# Patient Record
Sex: Female | Born: 1985 | Race: White | Hispanic: No | Smoking: Never smoker
Health system: Southern US, Community
[De-identification: ages and names within clinical notes are randomized; demographics above are authoritative.]

## PROBLEM LIST (undated history)

## (undated) DIAGNOSIS — F329 Major depressive disorder, single episode, unspecified: Secondary | ICD-10-CM

## (undated) DIAGNOSIS — F32A Depression, unspecified: Secondary | ICD-10-CM

## (undated) DIAGNOSIS — G43909 Migraine, unspecified, not intractable, without status migrainosus: Secondary | ICD-10-CM

## (undated) DIAGNOSIS — F419 Anxiety disorder, unspecified: Secondary | ICD-10-CM

## (undated) HISTORY — PX: DENTAL SURGERY: SHX609

---

## 2013-08-31 DIAGNOSIS — Z862 Personal history of diseases of the blood and blood-forming organs and certain disorders involving the immune mechanism: Secondary | ICD-10-CM | POA: Insufficient documentation

## 2013-08-31 DIAGNOSIS — Z8669 Personal history of other diseases of the nervous system and sense organs: Secondary | ICD-10-CM | POA: Insufficient documentation

## 2015-09-10 ENCOUNTER — Emergency Department (HOSPITAL_BASED_OUTPATIENT_CLINIC_OR_DEPARTMENT_OTHER): Payer: Managed Care, Other (non HMO)

## 2015-09-10 ENCOUNTER — Emergency Department (HOSPITAL_BASED_OUTPATIENT_CLINIC_OR_DEPARTMENT_OTHER)
Admission: EM | Admit: 2015-09-10 | Discharge: 2015-09-11 | Disposition: A | Discharge status: Home / Self Care | Payer: Managed Care, Other (non HMO) | Attending: Emergency Medicine | Admitting: Emergency Medicine

## 2015-09-10 ENCOUNTER — Encounter (HOSPITAL_BASED_OUTPATIENT_CLINIC_OR_DEPARTMENT_OTHER): Payer: Self-pay

## 2015-09-10 DIAGNOSIS — G43909 Migraine, unspecified, not intractable, without status migrainosus: Secondary | ICD-10-CM | POA: Diagnosis not present

## 2015-09-10 DIAGNOSIS — S99911A Unspecified injury of right ankle, initial encounter: Secondary | ICD-10-CM | POA: Diagnosis present

## 2015-09-10 DIAGNOSIS — Y9289 Other specified places as the place of occurrence of the external cause: Secondary | ICD-10-CM | POA: Insufficient documentation

## 2015-09-10 DIAGNOSIS — F419 Anxiety disorder, unspecified: Secondary | ICD-10-CM | POA: Diagnosis not present

## 2015-09-10 DIAGNOSIS — S93401A Sprain of unspecified ligament of right ankle, initial encounter: Secondary | ICD-10-CM | POA: Insufficient documentation

## 2015-09-10 DIAGNOSIS — Y998 Other external cause status: Secondary | ICD-10-CM | POA: Diagnosis not present

## 2015-09-10 DIAGNOSIS — X501XXA Overexertion from prolonged static or awkward postures, initial encounter: Secondary | ICD-10-CM | POA: Diagnosis not present

## 2015-09-10 DIAGNOSIS — F329 Major depressive disorder, single episode, unspecified: Secondary | ICD-10-CM | POA: Insufficient documentation

## 2015-09-10 DIAGNOSIS — Z79899 Other long term (current) drug therapy: Secondary | ICD-10-CM | POA: Insufficient documentation

## 2015-09-10 DIAGNOSIS — Y9389 Activity, other specified: Secondary | ICD-10-CM | POA: Diagnosis not present

## 2015-09-10 HISTORY — DX: Depression, unspecified: F32.A

## 2015-09-10 HISTORY — DX: Anxiety disorder, unspecified: F41.9

## 2015-09-10 HISTORY — DX: Major depressive disorder, single episode, unspecified: F32.9

## 2015-09-10 HISTORY — DX: Migraine, unspecified, not intractable, without status migrainosus: G43.909

## 2015-09-10 MED ORDER — HYDROCODONE-ACETAMINOPHEN 5-325 MG PO TABS: 1.0000 | ORAL_TABLET | ORAL | Status: DC | PRN

## 2015-09-10 MED ORDER — HYDROCODONE-ACETAMINOPHEN 5-325 MG PO TABS
1.0000 | ORAL_TABLET | Freq: Once | ORAL | Status: AC
  Administered 2015-09-10: 1 via ORAL
  Filled 2015-09-10: qty 1

## 2015-09-10 NOTE — ED Notes (Signed)
Swelling noted to lateral aspect of rt ankle. Ice and elevation to RLE implemented for pt comfort

## 2015-09-10 NOTE — ED Notes (Addendum)
States has taken Ibuprofen today, takes 4 tabs ( ) , taken at 0800, 1200, 1600hrs. No relief per pt statement

## 2015-09-10 NOTE — ED Notes (Signed)
Pt states she rec injury to rt ankle approx 0730hrs today, stepped off curb and felt foot twist and heard a "pop", presents with pain to rt ankle

## 2015-09-10 NOTE — ED Notes (Signed)
DC instructions reviewed with pt, discussed safety while taking PO pain med as prescribed by EDP, also discussed, ice application and elevation to aid in comfort measures. Crutch walking instructions provided by EDP. Opportunity for questions provided. Teach Back Method used. Significant other here with pt to drive her home

## 2015-09-10 NOTE — ED Provider Notes (Signed)
CSN: [id number]     Arrival date & time 09/10/15  1734 History   First MD Initiated Contact with Patient 09/10/15 1743     Chief Complaint  Patient presents with  . Ankle Injury      HPI  Patient presents with dilation of her ankle. Inversion right ankle getting out of a cart this morning. Complains of pain and swelling and walks with a limp. Heard "pop". No prior ankle injuries.  Past Medical History  Diagnosis Date  . Migraine   . Anxiety   . Depression    Past Surgical History  Procedure Laterality Date  . Dental surgery     No family history on file. Social History  Substance Use Topics  . Smoking status: Never Smoker   . Smokeless tobacco: None  . Alcohol Use: No   OB History    No data available     Review of Systems  Constitutional: Negative for fever, chills, diaphoresis, appetite change and fatigue.  HENT: Negative for mouth sores, sore throat and trouble swallowing.   Eyes: Negative for visual disturbance.  Respiratory: Negative for cough, chest tightness, shortness of breath and wheezing.   Cardiovascular: Negative for chest pain.  Gastrointestinal: Negative for nausea, vomiting, abdominal pain, diarrhea and abdominal distention.  Endocrine: Negative for polydipsia, polyphagia and polyuria.  Genitourinary: Negative for dysuria, frequency and hematuria.  Musculoskeletal: Positive for gait problem.       Lateral right ankle pain. No pain proximal in the lower leg.  Skin: Negative for color change, pallor and rash.  Neurological: Negative for dizziness, syncope, light-headedness and headaches.  Hematological: Does not bruise/bleed easily.  Psychiatric/Behavioral: Negative for behavioral problems and confusion.      Allergies  Sulfa antibiotics  Home Medications   Prior to Admission medications   Medication Sig Start Date End Date Taking? Authorizing Provider  atenolol (TENORMIN) 25 MG tablet Take 10 mg by mouth daily.   Yes Historical Provider, MD    HYDROcodone-acetaminophen (NORCO/VICODIN) 5-325 MG tablet Take 1 tablet by mouth every 4 (four) hours as needed. 09/10/15   Rolland Porter, MD  ondansetron (ZOFRAN-ODT) 4 MG disintegrating tablet Take 4 mg by mouth every 8 (eight) hours as needed for nausea or vomiting.   Yes Historical Provider, MD  PARoxetine (PAXIL) 10 MG tablet Take 40 mg by mouth daily.   Yes Historical Provider, MD  rizatriptan (MAXALT) 10 MG tablet Take 10 mg by mouth as needed for migraine. May repeat in 2 hours if needed   Yes Historical Provider, MD   BP 140/91 mmHg  Pulse 74  Temp(Src) 97.8 F (36.6 C) (Oral)  Resp 20  Ht 5' 5.5" (1.664 m)  Wt 211 lb (95.709 kg)  BMI 34.57 kg/m2  SpO2 100%  LMP 09/03/2015 Physical Exam  Constitutional: She is oriented to person, place, and time. She appears well-developed and well-nourished. No distress.  HENT:  Head: Normocephalic.  Eyes: Conjunctivae are normal. Pupils are equal, round, and reactive to light. No scleral icterus.  Neck: Normal range of motion. Neck supple. No thyromegaly present.  Cardiovascular: Normal rate and regular rhythm.  Exam reveals no gallop and no friction rub.   No murmur heard. Pulmonary/Chest: Effort normal and breath sounds normal. No respiratory distress. She has no wheezes. She has no rales.  Abdominal: Soft. Bowel sounds are normal. She exhibits no distension. There is no tenderness. There is no rebound.  Musculoskeletal: Normal range of motion.       Feet:  Neurological:  She is alert and oriented to person, place, and time.  Skin: Skin is warm and dry. No rash noted.  Psychiatric: She has a normal mood and affect. Her behavior is normal.    ED Course  Procedures (including critical care time) Labs Review Labs Reviewed - No data to display  Imaging Review Dg Ankle Complete Right  09/10/2015  CLINICAL DATA:  Twisting injury right ankle today with pain and swelling laterally. Initial encounter. EXAM: RIGHT ANKLE - COMPLETE 3+ VIEW  COMPARISON:  None. FINDINGS: There is no evidence of fracture, dislocation, or joint effusion. There is no evidence of arthropathy or other focal bone abnormality. Soft tissues are unremarkable. IMPRESSION: Negative exam. Electronically Signed   By: Drusilla Kanner M.D.   On: 09/10/2015 18:15   I have personally reviewed and evaluated these images and lab results as part of my medical decision-making.   EKG Interpretation None      MDM   Final diagnoses:  Ankle sprain, right, initial encounter    No fracture. Educated regarding care and treatment of her lateral ankle sprain. Nonweightbearing for 48 hours. Slowly increase weightbearing as tolerated.    Rolland Porter, MD 09/10/15 [phone]

## 2015-09-10 NOTE — Discharge Instructions (Signed)
Nonweightbearing for the next 48 hours. Slowly increase her weightbearing as your pain and swelling improved.   Ankle Sprain An ankle sprain is an injury to the strong, fibrous tissues (ligaments) that hold the bones of your ankle joint together.  CAUSES An ankle sprain is usually caused by a fall or by twisting your ankle. Ankle sprains most commonly occur when you step on the outer edge of your foot, and your ankle turns inward. People who participate in sports are more prone to these types of injuries.  SYMPTOMS   Pain in your ankle. The pain may be present at rest or only when you are trying to stand or walk.  Swelling.  Bruising. Bruising may develop immediately or within 1 to 2 days after your injury.  Difficulty standing or walking, particularly when turning corners or changing directions. DIAGNOSIS  Your caregiver will ask you details about your injury and perform a physical exam of your ankle to determine if you have an ankle sprain. During the physical exam, your caregiver will press on and apply pressure to specific areas of your foot and ankle. Your caregiver will try to move your ankle in certain ways. An X-ray exam may be done to be sure a bone was not broken or a ligament did not separate from one of the bones in your ankle (avulsion fracture).  TREATMENT  Certain types of braces can help stabilize your ankle. Your caregiver can make a recommendation for this. Your caregiver may recommend the use of medicine for pain. If your sprain is severe, your caregiver may refer you to a surgeon who helps to restore function to parts of your skeletal system (orthopedist) or a physical therapist. HOME CARE INSTRUCTIONS   Apply ice to your injury for 1-2 days or as directed by your caregiver. Applying ice helps to reduce inflammation and pain.  Put ice in a plastic bag.  Place a towel between your skin and the bag.  Leave the ice on for 15-20 minutes at a time, every 2 hours while you  are awake.  Only take over-the-counter or prescription medicines for pain, discomfort, or fever as directed by your caregiver.  Elevate your injured ankle above the level of your heart as much as possible for 2-3 days.  If your caregiver recommends crutches, use them as instructed. Gradually put weight on the affected ankle. Continue to use crutches or a cane until you can walk without feeling pain in your ankle.  If you have a plaster splint, wear the splint as directed by your caregiver. Do not rest it on anything harder than a pillow for the first 24 hours. Do not put weight on it. Do not get it wet. You may take it off to take a shower or bath.  You may have been given an elastic bandage to wear around your ankle to provide support. If the elastic bandage is too tight (you have numbness or tingling in your foot or your foot becomes cold and blue), adjust the bandage to make it comfortable.  If you have an air splint, you may blow more air into it or let air out to make it more comfortable. You may take your splint off at night and before taking a shower or bath. Wiggle your toes in the splint several times per day to decrease swelling. SEEK MEDICAL CARE IF:   You have rapidly increasing bruising or swelling.  Your toes feel extremely cold or you lose feeling in your foot.  Your  pain is not relieved with medicine. SEEK IMMEDIATE MEDICAL CARE IF:  Your toes are numb or blue.  You have severe pain that is increasing. MAKE SURE YOU:   Understand these instructions.  Will watch your condition.  Will get help right away if you are not doing well or get worse.   This information is not intended to replace advice given to you by your health care provider. Make sure you discuss any questions you have with your health care provider.   Document Released: 08/03/2005 Document Revised: 08/24/2014 Document Reviewed: 08/15/2011 Elsevier Interactive Patient Education [employer]

## 2015-09-10 NOTE — ED Notes (Signed)
Pt reports this morning twisted R ankle while getting out of car.  Ambulatory with mild limp.

## 2015-11-01 ENCOUNTER — Emergency Department (INDEPENDENT_AMBULATORY_CARE_PROVIDER_SITE_OTHER)
Admission: EM | Admit: 2015-11-01 | Discharge: 2015-11-01 | Disposition: A | Discharge status: Home / Self Care | Payer: Managed Care, Other (non HMO) | Source: Home / Self Care | Attending: Family Medicine | Admitting: Family Medicine

## 2015-11-01 ENCOUNTER — Emergency Department (INDEPENDENT_AMBULATORY_CARE_PROVIDER_SITE_OTHER): Payer: Managed Care, Other (non HMO)

## 2015-11-01 DIAGNOSIS — M25571 Pain in right ankle and joints of right foot: Secondary | ICD-10-CM | POA: Diagnosis not present

## 2015-11-01 DIAGNOSIS — S93401A Sprain of unspecified ligament of right ankle, initial encounter: Secondary | ICD-10-CM

## 2015-11-01 DIAGNOSIS — M24271 Disorder of ligament, right ankle: Secondary | ICD-10-CM | POA: Diagnosis not present

## 2015-11-01 MED ORDER — HYDROCODONE-ACETAMINOPHEN 5-325 MG PO TABS: ORAL_TABLET | ORAL | Status: DC

## 2015-11-01 NOTE — Discharge Instructions (Signed)
Apply ice pack for 30 minutes every 1 to 2 hours today and tomorrow.  Elevate.  Use crutches for 3 to 5 days.  Wear Ace wrap until swelling decreases.  Wear brace for about 2 to 3 weeks.  Begin range of motion and stretching exercises in about 5 days as per instruction sheet.  May take Ibuprofen 200mg, 4 tabs every 8 hours with food.  ° ° °Ankle Sprain °An ankle sprain is an injury to the strong, fibrous tissues (ligaments) that hold the bones of your ankle joint together.  °CAUSES °An ankle sprain is usually caused by a fall or by twisting your ankle. Ankle sprains most commonly occur when you step on the outer edge of your foot, and your ankle turns inward. People who participate in sports are more prone to these types of injuries.  °SYMPTOMS  °· Pain in your ankle. The pain may be present at rest or only when you are trying to stand or walk. °· Swelling. °· Bruising. Bruising may develop immediately or within 1 to 2 days after your injury. °· Difficulty standing or walking, particularly when turning corners or changing directions. °DIAGNOSIS  °Your caregiver will ask you details about your injury and perform a physical exam of your ankle to determine if you have an ankle sprain. During the physical exam, your caregiver will press on and apply pressure to specific areas of your foot and ankle. Your caregiver will try to move your ankle in certain ways. An X-ray exam may be done to be sure a bone was not broken or a ligament did not separate from one of the bones in your ankle (avulsion fracture).  °TREATMENT  °Certain types of braces can help stabilize your ankle. Your caregiver can make a recommendation for this. Your caregiver may recommend the use of medicine for pain. If your sprain is severe, your caregiver may refer you to a surgeon who helps to restore function to parts of your skeletal system (orthopedist) or a physical therapist. °HOME CARE INSTRUCTIONS  °· Apply ice to your injury for 1-2 days or as  directed by your caregiver. Applying ice helps to reduce inflammation and pain. °¨ Put ice in a plastic bag. °¨ Place a towel between your skin and the bag. °¨ Leave the ice on for 15-20 minutes at a time, every 2 hours while you are awake. °· Only take over-the-counter or prescription medicines for pain, discomfort, or fever as directed by your caregiver. °· Elevate your injured ankle above the level of your heart as much as possible for 2-3 days. °· If your caregiver recommends crutches, use them as instructed. Gradually put weight on the affected ankle. Continue to use crutches or a cane until you can walk without feeling pain in your ankle. °· If you have a plaster splint, wear the splint as directed by your caregiver. Do not rest it on anything harder than a pillow for the first 24 hours. Do not put weight on it. Do not get it wet. You may take it off to take a shower or bath. °· You may have been given an elastic bandage to wear around your ankle to provide support. If the elastic bandage is too tight (you have numbness or tingling in your foot or your foot becomes cold and blue), adjust the bandage to make it comfortable. °· If you have an air splint, you may blow more air into it or let air out to make it more comfortable. You may take   your splint off at night and before taking a shower or bath. Wiggle your toes in the splint several times per day to decrease swelling. °SEEK MEDICAL CARE IF:  °· You have rapidly increasing bruising or swelling. °· Your toes feel extremely cold or you lose feeling in your foot. °· Your pain is not relieved with medicine. °SEEK IMMEDIATE MEDICAL CARE IF: °· Your toes are numb or blue. °· You have severe pain that is increasing. °MAKE SURE YOU:  °· Understand these instructions. °· Will watch your condition. °· Will get help right away if you are not doing well or get worse. °  °This information is not intended to replace advice given to you by your health care provider. Make  sure you discuss any questions you have with your health care provider. °  °Document Released: 08/03/2005 Document Revised: 08/24/2014 Document Reviewed: 08/15/2011 °Elsevier Interactive Patient Education ©2016 [employer] ° °

## 2015-11-01 NOTE — ED Notes (Signed)
Pt stepped off of curb today, rolling her right ankle and heard a pop. Previous similar injury to the same ankle. Pain goes into her foot as well. Used ice and elevation and Advil.

## 2015-11-01 NOTE — ED Provider Notes (Signed)
CSN: [id number]     Arrival date & time 11/01/15  1905 History   First MD Initiated Contact with Patient 11/01/15 2008     Chief Complaint  Patient presents with  . Ankle Pain      HPI Comments: While stepping off a curb today, patient inverted her right ankle and heard a popping sound.  She had a similar injury about 4 months ago.  Patient is a 30 y.o. female presenting with ankle pain. The history is provided by the patient and a parent.  Ankle Pain Location:  Ankle Time since incident:  4 hours Injury: yes   Mechanism of injury comment:  Inverted ankle Ankle location:  R ankle Pain details:    Quality:  Aching   Radiates to: right foot.   Severity:  Moderate   Onset quality:  Sudden   Duration:  4 hours   Timing:  Constant   Progression:  Unchanged Chronicity:  Recurrent Prior injury to area:  Yes Relieved by:  Nothing Worsened by:  Bearing weight Ineffective treatments:  Ice and NSAIDs Associated symptoms: decreased ROM, stiffness and swelling   Associated symptoms: no back pain, no muscle weakness, no numbness and no tingling     Past Medical History  Diagnosis Date  . Migraine   . Anxiety   . Depression    Past Surgical History  Procedure Laterality Date  . Dental surgery     No family history on file. Social History  Substance Use Topics  . Smoking status: Never Smoker   . Smokeless tobacco: Not on file  . Alcohol Use: No   OB History    No data available     Review of Systems  Musculoskeletal: Positive for stiffness. Negative for back pain.  All other systems reviewed and are negative.   Allergies  Sulfa antibiotics  Home Medications   Prior to Admission medications   Medication Sig Start Date End Date Taking? Authorizing Provider  clonazePAM (KLONOPIN) 1 MG tablet Take 1 mg by mouth 2 (two) times daily.   Yes Historical Provider, MD  PARoxetine (PAXIL) 10 MG tablet Take 40 mg by mouth daily.   Yes Historical Provider, MD  atenolol  (TENORMIN) 25 MG tablet Take 10 mg by mouth daily.    Historical Provider, MD  HYDROcodone-acetaminophen (NORCO/VICODIN) 5-325 MG tablet Take one by mouth at bedtime as needed for pain 11/01/15   Lattie HawStephen A Zekiah Caruth, MD  rizatriptan (MAXALT) 10 MG tablet Take 10 mg by mouth as needed for migraine. May repeat in 2 hours if needed    Historical Provider, MD   Meds Ordered and Administered this Visit  Medications - No data to display  BP 129/85 mmHg  Pulse 89  Ht 5' 5.5" (1.664 m)  SpO2 100%  LMP 10/29/2015 No data found.   Physical Exam  Constitutional: She is oriented to person, place, and time. She appears well-developed and well-nourished. No distress.  HENT:  Head: Atraumatic.  Eyes: Pupils are equal, round, and reactive to light.  Neck: Normal range of motion.  Musculoskeletal:       Right ankle: She exhibits decreased range of motion and swelling. She exhibits no ecchymosis, no deformity, no laceration and normal pulse. Tenderness. Lateral malleolus, AITFL and CF ligament tenderness found. No head of 5th metatarsal and no proximal fibula tenderness found. Achilles tendon normal.       Feet:  Neurological: She is alert and oriented to person, place, and time.  Skin: Skin is warm  and dry.  Nursing note and vitals reviewed.   ED Course  Procedures  None   Imaging Review Dg Ankle Complete Right  11/01/2015  CLINICAL DATA:  Stepped off curb and twisted right ankle and foot. Right lateral and anterior ankle and foot pain and swelling. Initial encounter. EXAM: RIGHT ANKLE - COMPLETE 3+ VIEW COMPARISON:  None. FINDINGS: There is no evidence of fracture or dislocation. The ankle mortise is intact; the interosseous space is within normal limits. No talar tilt or subluxation is seen. The joint spaces are preserved. No significant soft tissue abnormalities are seen. IMPRESSION: No evidence of fracture or dislocation. Electronically Signed   By: Roanna Raider M.D.   On: 11/01/2015 19:53   Dg  Foot Complete Right  11/01/2015  CLINICAL DATA:  Lateral and anterior right foot and ankle pain after slipping off a curb this morning, twisting her right ankle and foot and feeling a popping sensation. EXAM: RIGHT FOOT COMPLETE - 3+ VIEW COMPARISON:  Right ankle radiographs dated 09/10/2015 and right ankle radiographs obtained today. FINDINGS: Minimal inferior calcaneal spur formation. No fracture or dislocation seen. IMPRESSION: No fracture. Electronically Signed   By: Beckie Salts M.D.   On: 11/01/2015 19:55      MDM   1. Right ankle sprain, initial encounter   2. Ankle ligament laxity, right    Ace wrap applied.  Rx for Lortab at bedtime.  Patient has crutches and AirCast splint at home. Apply ice pack for 30 minutes every 1 to 2 hours today and tomorrow.  Elevate.  Use crutches for 3 to 5 days.  Wear Ace wrap until swelling decreases.  Wear brace for about 2 to 3 weeks.  Begin range of motion and stretching exercises in about 5 days as per instruction sheet.  May take Ibuprofen , 4 tabs every 8 hours with food.  Followup with Dr. Rodney Langton or Dr. Clementeen [facility] ([facility])       Lattie Haw, MD 11/05/15 [phone]

## 2015-11-02 ENCOUNTER — Telehealth: Payer: Self-pay | Admitting: Emergency Medicine

## 2015-11-02 MED ORDER — OXYCODONE-ACETAMINOPHEN 5-325 MG PO TABS: 1.0000 | ORAL_TABLET | Freq: Four times a day (QID) | ORAL | Status: DC | PRN

## 2015-11-07 ENCOUNTER — Encounter (HOSPITAL_BASED_OUTPATIENT_CLINIC_OR_DEPARTMENT_OTHER): Payer: Self-pay | Admitting: *Deleted

## 2015-11-07 ENCOUNTER — Emergency Department (HOSPITAL_BASED_OUTPATIENT_CLINIC_OR_DEPARTMENT_OTHER): Payer: Managed Care, Other (non HMO)

## 2015-11-07 ENCOUNTER — Emergency Department (HOSPITAL_BASED_OUTPATIENT_CLINIC_OR_DEPARTMENT_OTHER)
Admission: EM | Admit: 2015-11-07 | Discharge: 2015-11-07 | Disposition: A | Discharge status: Home / Self Care | Payer: Managed Care, Other (non HMO) | Attending: Emergency Medicine | Admitting: Emergency Medicine

## 2015-11-07 DIAGNOSIS — F329 Major depressive disorder, single episode, unspecified: Secondary | ICD-10-CM | POA: Insufficient documentation

## 2015-11-07 DIAGNOSIS — Y990 Civilian activity done for income or pay: Secondary | ICD-10-CM | POA: Insufficient documentation

## 2015-11-07 DIAGNOSIS — S6991XA Unspecified injury of right wrist, hand and finger(s), initial encounter: Secondary | ICD-10-CM | POA: Diagnosis present

## 2015-11-07 DIAGNOSIS — M25571 Pain in right ankle and joints of right foot: Secondary | ICD-10-CM

## 2015-11-07 DIAGNOSIS — G43909 Migraine, unspecified, not intractable, without status migrainosus: Secondary | ICD-10-CM | POA: Insufficient documentation

## 2015-11-07 DIAGNOSIS — Y9289 Other specified places as the place of occurrence of the external cause: Secondary | ICD-10-CM | POA: Diagnosis not present

## 2015-11-07 DIAGNOSIS — Y9389 Activity, other specified: Secondary | ICD-10-CM | POA: Insufficient documentation

## 2015-11-07 DIAGNOSIS — Z79899 Other long term (current) drug therapy: Secondary | ICD-10-CM | POA: Diagnosis not present

## 2015-11-07 DIAGNOSIS — W1849XA Other slipping, tripping and stumbling without falling, initial encounter: Secondary | ICD-10-CM | POA: Diagnosis not present

## 2015-11-07 DIAGNOSIS — F419 Anxiety disorder, unspecified: Secondary | ICD-10-CM | POA: Diagnosis not present

## 2015-11-07 MED ORDER — OXYCODONE-ACETAMINOPHEN 5-325 MG PO TABS: 1.0000 | ORAL_TABLET | Freq: Three times a day (TID) | ORAL | Status: DC | PRN

## 2015-11-07 NOTE — ED Notes (Signed)
Right ankle injury. Yesterday she twisted her ankle on the steps at work. Previous injury of same ankle 2 weeks ago.

## 2015-11-07 NOTE — ED Provider Notes (Signed)
CSN: [id number]     Arrival date & time 11/07/15  1953 History   First MD Initiated Contact with Patient 11/07/15 2127     Chief Complaint  Patient presents with  . Ankle Injury     (Consider location/radiation/quality/duration/timing/severity/associated sxs/prior Treatment) HPI Comments: Patient seen for ankle injury about two weeks ago. Yesterday she slipped and twisted her ankle at work.  Patient had been doing well prior to yesterday.  She has an appointment with ortho on 11/12/15.  Patient is a 30 y.o. female presenting with lower extremity injury. The history is provided by the patient. No language interpreter was used.  Ankle Injury This is a recurrent problem. The current episode started yesterday. The problem has been gradually worsening. Associated symptoms include arthralgias and joint swelling. The symptoms are aggravated by walking. She has tried ice and NSAIDs for the symptoms. The treatment provided no relief.    Past Medical History  Diagnosis Date  . Migraine   . Anxiety   . Depression    Past Surgical History  Procedure Laterality Date  . Dental surgery     No family history on file. Social History  Substance Use Topics  . Smoking status: Never Smoker   . Smokeless tobacco: None  . Alcohol Use: No   OB History    No data available     Review of Systems  Musculoskeletal: Positive for joint swelling and arthralgias.  All other systems reviewed and are negative.     Allergies  Sulfa antibiotics  Home Medications   Prior to Admission medications   Medication Sig Start Date End Date Taking? Authorizing Provider  Sertraline HCl (ZOLOFT PO) Take by mouth.   Yes Historical Provider, MD  atenolol (TENORMIN) 25 MG tablet Take 10 mg by mouth daily.    Historical Provider, MD  clonazePAM (KLONOPIN) 1 MG tablet Take 1 mg by mouth 2 (two) times daily.    Historical Provider, MD  HYDROcodone-acetaminophen (NORCO/VICODIN) 5-325 MG tablet Take one by mouth at  bedtime as needed for pain 11/01/15   Lattie Haw, MD  oxyCODONE-acetaminophen (ROXICET) 5-325 MG tablet Take 1 tablet by mouth every 6 (six) hours as needed for severe pain. 11/02/15   Lattie Haw, MD  PARoxetine (PAXIL) 10 MG tablet Take 40 mg by mouth daily.    Historical Provider, MD  rizatriptan (MAXALT) 10 MG tablet Take 10 mg by mouth as needed for migraine. May repeat in 2 hours if needed    Historical Provider, MD   BP 138/89 mmHg  Pulse 74  Temp(Src) 98 F (36.7 C) (Oral)  Resp 18  Ht 5' 5.5" (1.664 m)  Wt 97.07 kg  BMI 35.06 kg/m2  SpO2 100%  LMP 10/29/2015 Physical Exam  Constitutional: She is oriented to person, place, and time. She appears well-developed and well-nourished.  HENT:  Head: Normocephalic.  Eyes: Pupils are equal, round, and reactive to light.  Neck: Neck supple.  Cardiovascular: Normal rate and regular rhythm.   Pulmonary/Chest: Effort normal and breath sounds normal.  Abdominal: Soft.  Musculoskeletal: She exhibits edema and tenderness.       Right ankle: She exhibits decreased range of motion and swelling. She exhibits normal pulse. Tenderness. Lateral malleolus tenderness found.       Feet:  Neurological: She is alert and oriented to person, place, and time.  Skin: Skin is warm and dry.  Psychiatric: She has a normal mood and affect.  Nursing note and vitals reviewed.[id number]   ED  Course  Procedures (including critical care time) Labs Review Labs Reviewed - No data to display  Imaging Review Dg Ankle Complete Right  11/07/2015  CLINICAL DATA:  Fall with twisting injury to the right ankle yesterday. Pain and swelling laterally. EXAM: RIGHT ANKLE - COMPLETE 3+ VIEW COMPARISON:  11/01/2015 FINDINGS: There is no evidence of fracture, dislocation, or joint effusion. There is no evidence of arthropathy or other focal bone abnormality. Soft tissues are unremarkable. IMPRESSION: Negative. Electronically Signed   By: Burman NievesWilliam  Stevens M.D.   On: 11/07/2015  20:28   I have personally reviewed and evaluated these images and lab results as part of my medical decision-making.   EKG Interpretation None     Radiology results reviewed and shared with patient. No indication of fracture, dislocation, or effusion. Patient has ankle splint and crutches at home. Patient had been prescribed norco on initial visit, was getting relief, and was changed over to percocet.  Pain reports she disposed of remaining percocet after several doses had managed her pain and she was able to ambulate without pain. She is scheduled for ortho follow-up next week. MDM   Final diagnoses:  None  Ankle sprain. Care instructions provided. Return precautions discussed.      Felicie Mornavid Ehan Freas, NP 11/08/15 [id number]  Arby BarretteMarcy Pfeiffer, MD 11/11/15 [phone]

## 2015-11-07 NOTE — Discharge Instructions (Signed)

## 2015-11-12 ENCOUNTER — Telehealth: Payer: Self-pay | Admitting: *Deleted

## 2015-11-13 ENCOUNTER — Ambulatory Visit (INDEPENDENT_AMBULATORY_CARE_PROVIDER_SITE_OTHER): Payer: Managed Care, Other (non HMO) | Admitting: Sports Medicine

## 2015-11-13 ENCOUNTER — Encounter: Payer: Self-pay | Admitting: Sports Medicine

## 2015-11-13 VITALS — BP 127/87 | HR 76 | Resp 18 | Wt 214.0 lb

## 2015-11-13 DIAGNOSIS — S93401S Sprain of unspecified ligament of right ankle, sequela: Secondary | ICD-10-CM | POA: Diagnosis not present

## 2015-11-13 DIAGNOSIS — S93401A Sprain of unspecified ligament of right ankle, initial encounter: Secondary | ICD-10-CM | POA: Insufficient documentation

## 2015-11-13 MED ORDER — MELOXICAM 15 MG PO TABS: ORAL_TABLET | ORAL | Status: DC

## 2015-11-13 MED ORDER — TRAMADOL HCL 50 MG PO TABS
ORAL_TABLET | ORAL | Status: DC
Start: 2015-11-13 — End: 2015-12-12

## 2015-11-13 NOTE — Progress Notes (Signed)
   Subjective:    I'm seeing this patient as a consultation for:  Scherrie NovemberAshley Timmons, NP  CC: Right ankle sprains  HPI: This is a pleasant 30 year old female, over the past her she's had multiple ankle sprains, all inversions with persistent pain that she localizes over the lateral ankle. Symptoms are moderate, persistent, she takes a lot of ibuprofen, has never had physical therapy or advanced imaging.  Past medical history, Surgical history, Family history not pertinant except as noted below, Social history, Allergies, and medications have been entered into the medical record, reviewed, and no changes needed.   Review of Systems: No headache, visual changes, nausea, vomiting, diarrhea, constipation, dizziness, abdominal pain, skin rash, fevers, chills, night sweats, weight loss, swollen lymph nodes, body aches, joint swelling, muscle aches, chest pain, shortness of breath, mood changes, visual or auditory hallucinations.   Objective:   General: Well Developed, well nourished, and in no acute distress.  Neuro/Psych: Alert and oriented x3, extra-ocular muscles intact, able to move all 4 extremities, sensation grossly intact. Skin: Warm and dry, no rashes noted.  Respiratory: Not using accessory muscles, speaking in full sentences, trachea midline.  Cardiovascular: Pulses palpable, no extremity edema. Abdomen: Does not appear distended. Right Ankle: Slight visible swelling with tenderness over the ATFL and the CFL Range of motion is full in all directions. Strength is 5/5 in all directions. Stable lateral and medial ligaments; squeeze test and kleiger test unremarkable; Talar dome nontender; No pain at base of 5th MT; No tenderness over cuboid; No tenderness over N spot or navicular prominence No tenderness on posterior aspects of lateral and medial malleolus No sign of peroneal tendon subluxations; Negative tarsal tunnel tinel's Able to walk 4 steps.  Impression and Recommendations:     This case required medical decision making of moderate complexity.

## 2015-11-13 NOTE — Assessment & Plan Note (Signed)
Recurrent ankle sprains. At this point we are going to obtain an MRI, place and ASO, and do formal physical therapy. Adding meloxicam, and a short course of tramadol at night, she did request narcotics which concerns me.  She has had several fills of oxycodone and hydrocodone recently. Return for custom orthotics.

## 2015-11-14 ENCOUNTER — Institutional Professional Consult (permissible substitution): Payer: Managed Care, Other (non HMO) | Admitting: Sports Medicine

## 2015-11-18 ENCOUNTER — Ambulatory Visit (INDEPENDENT_AMBULATORY_CARE_PROVIDER_SITE_OTHER): Payer: Managed Care, Other (non HMO)

## 2015-11-18 DIAGNOSIS — S93491A Sprain of other ligament of right ankle, initial encounter: Secondary | ICD-10-CM

## 2015-11-18 DIAGNOSIS — X58XXXA Exposure to other specified factors, initial encounter: Secondary | ICD-10-CM | POA: Diagnosis not present

## 2015-11-21 ENCOUNTER — Ambulatory Visit (INDEPENDENT_AMBULATORY_CARE_PROVIDER_SITE_OTHER): Payer: Managed Care, Other (non HMO) | Admitting: Sports Medicine

## 2015-11-21 VITALS — BP 122/85 | HR 63 | Resp 18 | Wt 214.0 lb

## 2015-11-21 DIAGNOSIS — S93401S Sprain of unspecified ligament of right ankle, sequela: Secondary | ICD-10-CM | POA: Diagnosis not present

## 2015-11-21 NOTE — Progress Notes (Signed)

## 2015-11-21 NOTE — Assessment & Plan Note (Signed)
Custom orthotics as above, continue ASO brace, MRI does confirm complete ATFL tear. Continue meloxicam and tramadol, and aggressive formal physical therapy which will ultimately get her better.

## 2015-11-26 ENCOUNTER — Ambulatory Visit (INDEPENDENT_AMBULATORY_CARE_PROVIDER_SITE_OTHER): Payer: Managed Care, Other (non HMO) | Admitting: Rehabilitative and Restorative Service Providers"

## 2015-11-26 ENCOUNTER — Encounter: Payer: Self-pay | Admitting: Rehabilitative and Restorative Service Providers"

## 2015-11-26 DIAGNOSIS — M25571 Pain in right ankle and joints of right foot: Secondary | ICD-10-CM | POA: Diagnosis not present

## 2015-11-26 DIAGNOSIS — R269 Unspecified abnormalities of gait and mobility: Secondary | ICD-10-CM | POA: Diagnosis not present

## 2015-11-26 DIAGNOSIS — R531 Weakness: Secondary | ICD-10-CM | POA: Diagnosis not present

## 2015-11-26 NOTE — Therapy (Addendum)
[facility] [place] [address] [place], [place], [address] Phone: [phone]   Fax:  [phone]  Physical Therapy Evaluation  Patient Details  Name: Gloria Cooke MRN: [id number] Date of Birth: Jan 08, 1986 Referring Provider: Dr. Benjamin Stain  Encounter Date: 11/26/2015      PT End of Session - 11/26/15 1336    Visit Number 1   Number of Visits 12   Date for PT Re-Evaluation 01/07/16   PT Start Time 0852   PT Stop Time 0947   PT Time Calculation (min) 55 min   Activity Tolerance Patient tolerated treatment well      Past Medical History  Diagnosis Date  . Migraine   . Anxiety   . Depression     Past Surgical History  Procedure Laterality Date  . Dental surgery      There were no vitals filed for this visit.       Subjective Assessment - 11/26/15 0853    Subjective Gloria Cooke reports that she has numerous sprains of Rt ankle most recently ~3 weeks ago. She has sprains over a peroid of 4 years. MRII shows full tear of ligament.    Pertinent History Denies any medical problems   How long can you sit comfortably? 1 hour   How long can you stand comfortably? 5-10 min    How long can you walk comfortably? 10 min    Diagnostic tests MRI showed complete tear of ATFL   Patient Stated Goals get foot better   Currently in Pain? Yes   Pain Score 5    Pain Location Ankle   Pain Orientation Right   Pain Descriptors / Indicators Aching;Throbbing   Pain Type Chronic pain   Pain Onset More than a month ago   Pain Frequency Constant  in the past 3 weeks   Aggravating Factors  prolonged sitting; stansding; walking; stepping wrong    Pain Relieving Factors time            [facility] PT Assessment - 11/26/15 0001    Assessment   Medical Diagnosis Rt ankle sprain - ATFL complete tear   Referring Provider Dr. Benjamin Stain   Onset Date/Surgical Date 11/04/15   Hand Dominance Right   Next MD Visit 5/17   Prior Therapy none    Precautions   Precautions None   Required Braces or Orthoses Other Brace/Splint   Other Brace/Splint Ankle TSO   Balance Screen   Has the patient fallen in the past 6 months Yes   How many times? 3   Has the patient had a decrease in activity level because of a fear of falling?  No   Is the patient reluctant to leave their home because of a fear of falling?  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    Leisure household chores/ckhild care    Observation/Other Assessments   Focus on Therapeutic Outcomes (FOTO)  36% limitation    Sensation   Additional Comments tingling Rt ankle/foot on an intermittent basis   Posture/Postural Control   Posture Comments stands with knees hyperextended Rt > Lt; hips in ER    AROM   Overall AROM Comments WFL's bilat hips/knees    Right Ankle Dorsiflexion 2   Right Ankle Plantar Flexion 40   Right Ankle  Inversion 31   Right Ankle Eversion 3   Left Ankle Dorsiflexion 10   Left Ankle Plantar Flexion 51   Left Ankle Inversion 34   Left Ankle Eversion 19   Strength   Right Hip Extension 4+/5   Right Hip ABduction 4/5   Left Hip Extension --  5-/5   Right Ankle Dorsiflexion 4/5   Right Ankle Plantar Flexion 4/5   Right Ankle Inversion 4/5   Right Ankle Eversion 3+/5   Left Ankle Dorsiflexion --  5-/5   Left Ankle Plantar Flexion 4+/5  10 heel raise with UE support - wobbly   Left Ankle Inversion --  5-/5   Left Ankle Eversion --  5-/5   Flexibility   Hamstrings tight Rt 65 deg; Lt 70 deg   Quadriceps heel to buttock Rt ~5 inches; Lt ~ 3 inches    ITB slight tightness Rt > Lt   Piriformis tightness Rt > Lt    Palpation   Palpation comment tender to palpation Rt lateral malleolus/ankle area    Ambulation/Gait   Gait Comments  ambulates with limp Rt LE - decreased wt bearing Rt LE; feet in ER          treatment included: There ex : hamstring stretch 30 sec x 3; quad stretch 30 sec x 3 SLS 30 sec x 3 each LE Ankle eversion with red TB 10 x 1  Issued red TB for home   Modalities;  Rt ankle  E-stim IFC; to tolerance; for pain; x 15 min  Cold pac x 15 min                   PT Education - 11/26/15 0942    Education provided Yes   Education Details HEP; TENS unit   Person(s) Educated Patient   Methods Explanation;Demonstration;Tactile cues;Verbal cues;Handout   Comprehension Verbalized understanding;Returned demonstration;Verbal cues required;Tactile cues required             PT Long Term Goals - 11/26/15 1341    PT LONG TERM GOAL #1   Title Increase bilat ankle DF to 12-15 deg with no pain 01/07/16   Time 6   Period Weeks   Status New   PT LONG TERM GOAL #2   Title 5-/5 to 5/5 strength bilat ankles/hips 01/07/16   Time 6   Period Weeks   Status New   PT LONG TERM GOAL #3   Title Decrease pain Rt ankle to no more than 1/10 to 3/10 for 75% of day 01/07/16   Time 6   Period Weeks   Status New   PT LONG TERM GOAL #4   Title Improve gait pattern with pt to ambulate without limp 01/07/16   Time 6   Period Weeks   Status New   PT LONG TERM GOAL #5   Title I in HEP 01/07/16   Time 6   Period Weeks   Status New   PT LONG TERM GOAL #6   Title Improve FOTO to </= 30% limitation 01/07/16   Time 6   Period Weeks   Status New               Plan - 11/26/15 1337    Clinical Impression Statement Patient presents with chronic recurrent Rt ankle sprains with MRI showing complete tear of the Rt ATFL. She has poor posture and alignment; abnormal gait pattern; limited ankle ROM/strength; decreased hip strength; pain on a daily basis with limited functional abilities. Pt  will benefit from PT to address problems identified.    Rehab Potential Good   PT Frequency 2x / week   PT  Duration 6 weeks   PT Treatment/Interventions Patient/family education;Neuromuscular re-education;ADLs/Self Care Home Management;Therapeutic exercise;Therapeutic activities;Manual techniques;Dry needling;Cryotherapy;Electrical Stimulation;Iontophoresis 4mg /ml Dexamethasone;Moist Heat;Ultrasound   PT Next Visit Plan Progress stretching and strengthening program(gastroc/soleus stretches; strengthening LE); trial of ionto Rt ankle to address pain; modalities and manual work as indicated   PT Home Exercise Plan HEP; TENS info   Consulted and Agree with Plan of Care Patient      Patient will benefit from skilled therapeutic intervention in order to improve the following deficits and impairments:  Postural dysfunction, Improper body mechanics, Increased fascial restricitons, Decreased range of motion, Decreased mobility, Decreased strength, Abnormal gait, Pain, Decreased endurance, Decreased activity tolerance  Visit Diagnosis: Abnormal gait - Plan: PT plan of care cert/re-cert  Pain, ankle, right - Plan: PT plan of care cert/re-cert  Weakness generalized - Plan: PT plan of care cert/re-cert     Problem List Patient Active Problem List   Diagnosis Date Noted  . Sprain of ankle, right 11/13/2015    Bernese Doffing Rober MinionP Daviana Haymaker PT, MPH  11/26/2015, 1:47 PM  [facility] [address] [place], [place], [address] Phone: [phone]   Fax:  [phone]  Name: Gloria Cooke MRN: [id number] Date of Birth: 1986-02-19

## 2015-11-26 NOTE — Patient Instructions (Signed)
HIP: Hamstrings - Supine    Place strap around foot. Raise leg up, keep knee straight. Hold __30_ seconds. _3__ reps per set, 2___ sets per day, _7__ days per week   Hip Flexor, Quadricep Stretch: Belly Down (Strap)    Engage stable pelvic tilt. Hold top of foot with hand or strap. Hold for _30 seconds. Repeat __3__ times right leg.    Strengthening: Hip Abduction (Side-Lying)    Tighten muscles on front of left thigh, then lift leg __12-15__ inches from surface, keeping knee straight; leading up with heel.  Repeat __10__ times per set. Do __1-3__ sets per session. Do __1__ sessions per day.   Eversion: Resisted    With right foot in tubing loop, hold tubing around other foot to resist and turn foot out. Repeat _10___ times per set. Do __1-3__ sets per session. Do __2__ sessions per day.     Single Leg - Eyes Open    Holding support, lift right leg while maintaining balance over other leg. Progress to removing hands from support surface for longer periods of time. Hold__30-60__ seconds. Repeat __3-5__ times per session. Do __3-4__ sessions per day.  TENS UNIT: This is helpful for muscle pain and spasm.   Search and Purchase a TENS 7000 2nd edition at www.tenspros.com. It should be less than $30.     TENS unit instructions: Do not shower or bathe with the unit on Turn the unit off before removing electrodes or batteries If the electrodes lose stickiness add a drop of water to the electrodes after they are disconnected from the unit and place on plastic sheet. If you continued to have difficulty, call the TENS unit company to purchase more electrodes. Do not apply lotion on the skin area prior to use. Make sure the skin is clean and dry as this will help prolong the life of the electrodes. After use, always check skin for unusual red areas, rash or other skin difficulties. If there are any skin problems, does not apply electrodes to the same area. Never remove the  electrodes from the unit by pulling the wires. Do not use the TENS unit or electrodes other than as directed. Do not change electrode placement without consultating your therapist or physician. Keep 2 fingers with between each electrode.

## 2015-12-02 ENCOUNTER — Encounter: Payer: Self-pay | Admitting: Rehabilitative and Restorative Service Providers"

## 2015-12-02 ENCOUNTER — Ambulatory Visit (INDEPENDENT_AMBULATORY_CARE_PROVIDER_SITE_OTHER): Payer: Managed Care, Other (non HMO) | Admitting: Rehabilitative and Restorative Service Providers"

## 2015-12-02 DIAGNOSIS — R269 Unspecified abnormalities of gait and mobility: Secondary | ICD-10-CM | POA: Diagnosis not present

## 2015-12-02 DIAGNOSIS — M25571 Pain in right ankle and joints of right foot: Secondary | ICD-10-CM

## 2015-12-02 DIAGNOSIS — R531 Weakness: Secondary | ICD-10-CM

## 2015-12-02 NOTE — Therapy (Signed)
[facility] [place] [address] [place], [place], [address] Phone: [phone]   Fax:  [phone]  Physical Therapy Treatment  Patient Details  Name: Gloria Cooke MRN: [id number] Date of Birth: [birth year] Referring Provider: Dr. Benjamin Stain  Encounter Date: 12/02/2015      PT End of Session - 12/02/15 0848    Visit Number 2   Number of Visits 12   Date for PT Re-Evaluation 01/07/16   PT Start Time 0846   PT Stop Time 0943   PT Time Calculation (min) 57 min   Activity Tolerance Patient tolerated treatment well      Past Medical History  Diagnosis Date  . Migraine   . Anxiety   . Depression     Past Surgical History  Procedure Laterality Date  . Dental surgery      There were no vitals filed for this visit.      Subjective Assessment - 12/02/15 0849    Subjective Pt reports that she is OK - she did some hiking yesterday in her brace and ankle did ok. She is doing her exercises at home with no trouble.   Currently in Pain? Yes   Pain Score 5    Pain Location Ankle   Pain Orientation Right   Pain Descriptors / Indicators Aching   Pain Type Chronic pain   Pain Onset More than a month ago   Pain Frequency Intermittent                         [facility] Adult PT Treatment/Exercise - 12/02/15 0001    Cryotherapy   Number Minutes Cryotherapy 15 Minutes   Cryotherapy Location Ankle   Type of Cryotherapy Ice pack   Electrical Stimulation   Electrical Stimulation Location lateral ankle to dorsumm of foot Rt    Electrical Stimulation Action IFC   Electrical Stimulation Parameters to tolerance   Electrical Stimulation Goals Pain   Iontophoresis   Type of Iontophoresis Dexamethasone   Location lateral ankle    Dose 40 mAmp   Time 6 hr   Manual Therapy   Joint Mobilization ankle mobs    Passive ROM passive stretch ankle into DF    Ankle Exercises: Stretches   Soleus Stretch 3 reps;20 seconds   Gastroc Stretch 3 reps;20 seconds   Ankle Exercises: Standing   SLS 30 sec x 3 each LE    Toe Raise 10 reps;2 seconds   Other Standing Ankle Exercises hip abduction x 10 bilat; hip ext x 10 bilat facing wall    Ankle Exercises: Supine   T-Band TB  4 directions x 10 red TB                 PT Education - 12/02/15 0918    Education provided Yes   Education Details HEP   Person(s) Educated Patient   Methods Explanation;Demonstration;Tactile cues;Verbal cues;Handout   Comprehension Verbalized understanding;Returned demonstration;Verbal cues required;Tactile cues required             PT Long Term Goals - 12/02/15 0937    PT LONG TERM GOAL #1   Title Increase bilat ankle DF to 12-15 deg with no pain 01/07/16   Time 6   Period Weeks   Status On-going   PT LONG TERM GOAL #2   Title 5-/5 to 5/5 strength bilat ankles/hips 01/07/16   Time 6   Period Weeks   Status On-going   PT LONG TERM GOAL #  3   Title Decrease pain Rt ankle to no more than 1/10 to 3/10 for 75% of day 01/07/16   Time 6   Period Weeks   Status On-going   PT LONG TERM GOAL #4   Title Improve gait pattern with pt to ambulate without limp 01/07/16   Time 6   Period Weeks   Status On-going   PT LONG TERM GOAL #5   Title I in HEP 01/07/16   Time 6   Period Weeks   Status On-going   PT LONG TERM GOAL #6   Title Improve FOTO to </= 30% limitation 01/07/16   Time 6   Period Weeks   Status On-going               Plan - 12/02/15 0935    Clinical Impression Statement Progressing with rehab - only second visit. Added exercises without difficulty.    Rehab Potential Good   PT Frequency 2x / week   PT Duration 6 weeks   PT Treatment/Interventions Patient/family education;Neuromuscular re-education;ADLs/Self Care Home Management;Therapeutic exercise;Therapeutic activities;Manual techniques;Dry needling;Cryotherapy;Electrical Stimulation;Iontophoresis 4mg /ml Dexamethasone;Moist Heat;Ultrasound   PT Next  Visit Plan Progress stretching and strengthening program(gastroc/soleus stretches; strengthening LE); trial of ionto Rt ankle to address pain; modalities and manual work as indicated - (add prone hip ext/glut sets; step ups; SLS proprioception)    PT Home Exercise Plan HEP   Consulted and Agree with Plan of Care Patient      Patient will benefit from skilled therapeutic intervention in order to improve the following deficits and impairments:  Postural dysfunction, Improper body mechanics, Increased fascial restricitons, Decreased range of motion, Decreased mobility, Decreased strength, Abnormal gait, Pain, Decreased endurance, Decreased activity tolerance  Visit Diagnosis: Abnormal gait  Pain, ankle, right  Weakness generalized     Problem List Patient Active Problem List   Diagnosis Date Noted  . Sprain of ankle, right 11/13/2015    Caytlin Better Rober MinionP Alexsis Kathman PT, MPH  12/02/2015, 9:39 AM  [facility] [address] [place], [place], [address] Phone: [phone]   Fax:  [phone]  Name: Gloria Cooke MRN: [id number] Date of Birth: 1986/01/08

## 2015-12-02 NOTE — Patient Instructions (Signed)
Achilles / Gastroc, Standing    Stand, right foot behind, heel on floor and turned slightly out, leg straight, forward leg bent. Move hips forward. Hold _20-30 __ seconds. Repeat _3__ times per session. Do _2-3__ sessions per day.   Achilles / Soleus, Standing    Stand, right foot behind, heel on floor and turned slightly out. Lower hips and bend knees. Hold _20-30__ seconds. Repeat __3_ times per session. Do __2-3_ sessions per day.   EXTENSION: Standing (Active)    Stand, both feet flat. Draw right leg behind body as far as possible. Use _0-2__ lbs. Complete _2-3__ sets of _10__ repetitions. Perform __1_ sessions per day.   Strengthening: Hip Extension - Resisted    With tubing around right ankle, face anchor and pull leg straight back. Repeat __10__ times per set. Do _1-3___ sets per session. Do ___1_ sessions per day.    Strengthening: Hip Extension - Resisted    With tubing around right ankle, face anchor and pull leg straight back. Repeat _10___ times per set. Do _1-3__ sets per session. Do __1__ sessions per day.    Dorsiflexion: Resisted    Facing anchor, tubing around left foot, pull toward face.  Repeat __10__ times per set. Do __1-3__ sets per session. Do __1__ sessions per day.    Eversion: Resisted    With right foot in tubing loop, hold tubing around other foot to resist and turn foot out. Repeat __10__ times per set. Do __1-3__ sets per session. Do __1__ sessions per day.   Inversion: Resisted    Cross legs with right leg underneath, foot in tubing loop. Hold tubing around other foot to resist and turn foot in. Repeat _10___ times per set. Do __1-3__ sets per session. Do __1__ sessions per day.    Plantar Flexion: Resisted    Anchor behind, tubing around left foot, press down. Repeat _10___ times per set. Do _1-3___ sets per session. Do __1__ sessions per day.

## 2015-12-03 ENCOUNTER — Ambulatory Visit (INDEPENDENT_AMBULATORY_CARE_PROVIDER_SITE_OTHER): Payer: Managed Care, Other (non HMO) | Admitting: Psychiatry

## 2015-12-03 ENCOUNTER — Encounter (HOSPITAL_COMMUNITY): Payer: Self-pay | Admitting: Psychiatry

## 2015-12-03 VITALS — BP 126/70 | HR 77 | Ht 64.5 in | Wt 214.0 lb

## 2015-12-03 DIAGNOSIS — F331 Major depressive disorder, recurrent, moderate: Secondary | ICD-10-CM

## 2015-12-03 DIAGNOSIS — F410 Panic disorder [episodic paroxysmal anxiety] without agoraphobia: Secondary | ICD-10-CM

## 2015-12-03 DIAGNOSIS — F411 Generalized anxiety disorder: Secondary | ICD-10-CM | POA: Diagnosis not present

## 2015-12-03 MED ORDER — BUPROPION HCL ER (SR) 100 MG PO TB12: 100.0000 mg | ORAL_TABLET | Freq: Every day | ORAL | Status: DC

## 2015-12-03 MED ORDER — CLONAZEPAM 0.5 MG PO TABS: 0.5000 mg | ORAL_TABLET | Freq: Two times a day (BID) | ORAL | Status: DC
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
## 2015-12-04 ENCOUNTER — Encounter: Payer: Managed Care, Other (non HMO) | Admitting: Rehabilitative and Restorative Service Providers"

## 2015-12-05 ENCOUNTER — Encounter: Payer: Managed Care, Other (non HMO) | Admitting: Rehabilitative and Restorative Service Providers"

## 2015-12-05 ENCOUNTER — Encounter: Payer: Self-pay | Admitting: Sports Medicine

## 2015-12-05 DIAGNOSIS — S93401S Sprain of unspecified ligament of right ankle, sequela: Secondary | ICD-10-CM

## 2015-12-05 DIAGNOSIS — S93409S Sprain of unspecified ligament of unspecified ankle, sequela: Secondary | ICD-10-CM

## 2015-12-05 DIAGNOSIS — IMO0001 Reserved for inherently not codable concepts without codable children: Secondary | ICD-10-CM

## 2015-12-09 ENCOUNTER — Ambulatory Visit (INDEPENDENT_AMBULATORY_CARE_PROVIDER_SITE_OTHER): Payer: Managed Care, Other (non HMO) | Admitting: Rehabilitative and Restorative Service Providers"

## 2015-12-09 ENCOUNTER — Encounter: Payer: Self-pay | Admitting: Rehabilitative and Restorative Service Providers"

## 2015-12-09 DIAGNOSIS — M25571 Pain in right ankle and joints of right foot: Secondary | ICD-10-CM

## 2015-12-09 DIAGNOSIS — R531 Weakness: Secondary | ICD-10-CM

## 2015-12-09 DIAGNOSIS — R269 Unspecified abnormalities of gait and mobility: Secondary | ICD-10-CM

## 2015-12-09 NOTE — Therapy (Signed)
[facility] [place] [address] [place], [place], [address] Phone: [phone]   Fax:  [phone]  Physical Therapy Treatment  Patient Details  Name: Gloria Cooke MRN: [id number] Date of Birth: 23-Feb-1986 Referring Provider: Dr. Benjamin Stain  Encounter Date: 12/09/2015      PT End of Session - 12/09/15 0901    Visit Number 3   Number of Visits 12   Date for PT Re-Evaluation 01/07/16   PT Start Time 0859   PT Stop Time 0938   PT Time Calculation (min) 39 min   Activity Tolerance Patient tolerated treatment well      Past Medical History  Diagnosis Date  . Migraine   . Anxiety   . Depression     Past Surgical History  Procedure Laterality Date  . Dental surgery      There were no vitals filed for this visit.      Subjective Assessment - 12/09/15 0902    Subjective Went to check the mail last night and turned her ankle - she did not have her brace on. Some increaed soreness noted today but not really pain. Patch helped a lot at last treatment. She has been working on her exercises at home.    Currently in Pain? No/denies                         [facility] Adult PT Treatment/Exercise - 12/09/15 0001    Knee/Hip Exercises: Prone   Hip Extension Right;Left;20 reps   Cryotherapy   Number Minutes Cryotherapy 15 Minutes   Cryotherapy Location Ankle   Type of Cryotherapy Ice pack   Electrical Stimulation   Electrical Stimulation Location lateral ankle to dorsumm of foot Rt    Electrical Stimulation Action IFC   Electrical Stimulation Parameters to tolerance   Electrical Stimulation Goals Pain   Iontophoresis   Type of Iontophoresis Dexamethasone   Location lateral ankle    Dose 40 mAmp   Time 6 hr   Ankle Exercises: Sidelying   Other Sidelying Ankle Exercises hip abduction with ankle in neutral to eversion x20    Ankle Exercises: Aerobic   Stationary Bike Nu step L4 x 4    Ankle Exercises:  Stretches   Soleus Stretch 3 reps;20 seconds   Gastroc Stretch 3 reps;20 seconds   Ankle Exercises: Standing   SLS 30 sec x 3 each LE    Toe Raise 20 reps;2 seconds   Other Standing Ankle Exercises hip abduction x 10 bilat; hip ext x 10 bilat facing wall    Ankle Exercises: Supine   T-Band TB  4 directions x 10 red TB   working on technique - pause and hold and position of ankle                PT Education - 12/09/15 0931    Education provided Yes   Education Details HEP   Person(s) Educated Patient   Methods Explanation;Demonstration;Tactile cues;Verbal cues;Handout   Comprehension Verbalized understanding;Returned demonstration;Verbal cues required;Tactile cues required             PT Long Term Goals - 12/02/15 0937    PT LONG TERM GOAL #1   Title Increase bilat ankle DF to 12-15 deg with no pain 01/07/16   Time 6   Period Weeks   Status On-going   PT LONG TERM GOAL #2   Title 5-/5 to 5/5 strength bilat ankles/hips 01/07/16   Time 6  Period Weeks   Status On-going   PT LONG TERM GOAL #3   Title Decrease pain Rt ankle to no more than 1/10 to 3/10 for 75% of day 01/07/16   Time 6   Period Weeks   Status On-going   PT LONG TERM GOAL #4   Title Improve gait pattern with pt to ambulate without limp 01/07/16   Time 6   Period Weeks   Status On-going   PT LONG TERM GOAL #5   Title I in HEP 01/07/16   Time 6   Period Weeks   Status On-going   PT LONG TERM GOAL #6   Title Improve FOTO to </= 30% limitation 01/07/16   Time 6   Period Weeks   Status On-going             Patient will benefit from skilled therapeutic intervention in order to improve the following deficits and impairments:     Visit Diagnosis: Abnormal gait  Pain, ankle, right  Weakness generalized     Problem List Patient Active Problem List   Diagnosis Date Noted  . Sprain of ankle, right 11/13/2015  . History of migraine headaches 08/31/2013  . History of anemia  08/31/2013    Jocilynn Grade Rober MinionP Jennise Both PT, MPH  12/09/2015, 10:18 AM  [facility] [address] [place], [place], [address] Phone: [phone]   Fax:  [phone]  Name: Gloria Cooke MRN: [id number] Date of Birth: November 13, 1985

## 2015-12-09 NOTE — Patient Instructions (Signed)
Strengthening: Hip Extension (Prone)    Tighten muscles on front of left thigh, then lift leg __8-10__ inches from surface, keeping knee locked. Repeat __10__ times per set. Do __2-3__ sets per session. Do __1__ sessions per day.    Strengthening: Hip Abduction (Side-Lying)    Tighten muscles on front of left thigh, then lift leg __10-12__ inches from surface, keeping knee locked.  Repeat __10__ times per set. Do __2-3__ sets per session. Do __1__ sessions per day.    Balance: Three-Way Leg Swing    Stand on left foot, hands on hips. Reach other foot forward _10__ times, sideways __10__ times, back __10__ times. Hold each position __2-3__ seconds. Relax. Repeat _5-10__ times per set.  Do _2-3___ sessions per day.

## 2015-12-11 ENCOUNTER — Encounter: Payer: Self-pay | Admitting: Rehabilitative and Restorative Service Providers"

## 2015-12-11 ENCOUNTER — Ambulatory Visit (INDEPENDENT_AMBULATORY_CARE_PROVIDER_SITE_OTHER): Payer: Managed Care, Other (non HMO) | Admitting: Rehabilitative and Restorative Service Providers"

## 2015-12-11 ENCOUNTER — Other Ambulatory Visit: Payer: Self-pay | Admitting: Sports Medicine

## 2015-12-11 DIAGNOSIS — R269 Unspecified abnormalities of gait and mobility: Secondary | ICD-10-CM | POA: Diagnosis not present

## 2015-12-11 DIAGNOSIS — R531 Weakness: Secondary | ICD-10-CM | POA: Diagnosis not present

## 2015-12-11 DIAGNOSIS — M25571 Pain in right ankle and joints of right foot: Secondary | ICD-10-CM | POA: Diagnosis not present

## 2015-12-11 NOTE — Addendum Note (Signed)
Addended by: Monica BectonHEKKEKANDAM, Vinia Jemmott J on: 12/11/2015 12:07 PM   Modules accepted: Orders

## 2015-12-11 NOTE — Therapy (Addendum)
[place], [place], [address] Phone: [phone]   Fax:  [phone]  Physical Therapy Treatment  Patient Details  Name: Gloria Cooke MRN: [id number] Date of Birth: November 03, 1985 Referring Provider: dr. Darene Lamer  Encounter Date: 12/11/2015      PT End of Session - 12/11/15 0849    Visit Number 4   Number of Visits 12   Date for PT Re-Evaluation 01/07/16   PT Start Time 0847   PT Stop Time 0940   PT Time Calculation (min) 53 min   Activity Tolerance Patient tolerated treatment well      Past Medical History  Diagnosis Date  . Migraine   . Anxiety   . Depression     Past Surgical History  Procedure Laterality Date  . Dental surgery      There were no vitals filed for this visit.      Subjective Assessment - 12/11/15 0850    Subjective Patient reports that she is working on her exercises at home. Ankle feels achey this morning.    Currently in Pain? Yes   Pain Score 3    Pain Location Ankle   Pain Orientation Right   Pain Descriptors / Indicators Aching            [facility] PT Assessment - 12/11/15 0001    Assessment   Medical Diagnosis Rt ankle sprain - ATFL complete tear   Referring Provider dr. Darene Lamer   Onset Date/Surgical Date 11/04/15   Hand Dominance Right   Next MD Visit 5/17   Prior Therapy none   AROM   Right Ankle Dorsiflexion 6   Right Ankle Plantar Flexion 55   Right Ankle Inversion 36   Right Ankle Eversion 14   Strength   Right Ankle Dorsiflexion 4+/5   Right Ankle Inversion 4+/5   Right Ankle Eversion 4-/5                     [facility] Adult PT Treatment/Exercise - 12/11/15 0001    Knee/Hip Exercises: Prone   Hip Extension Right;Left;20 reps   Electrical Stimulation   Electrical Stimulation Location lateral ankle to dorsumm of foot Rt    Electrical Stimulation Action IFC   Electrical Stimulation Parameters to tolerance   Electrical Stimulation Goals  Pain   Iontophoresis   Type of Iontophoresis Dexamethasone   Location lateral ankle    Dose 40 mAmp   Time 6 hr   Vasopneumatic   Number Minutes Vasopneumatic  15 minutes   Vasopnuematic Location  Ankle  Rt   Vasopneumatic Pressure Medium   Vasopneumatic Temperature  3*   Manual Therapy   Joint Mobilization ankle mobs    Passive ROM passive stretch ankle into DF    Ankle Exercises: Aerobic   Stationary Bike Nu step L5 x 4    Ankle Exercises: Stretches   Soleus Stretch 3 reps;30 seconds   Gastroc Stretch 2 reps;30 seconds   Ankle Exercises: Standing   SLS 30 sec x 3 each LE   added f/s/b mvt opposite LE; body blade; alt touch step x20   Toe Raise 20 reps;2 seconds   Other Standing Ankle Exercises hip abduction x 10 bilat; hip ext x 10 bilat facing wall   added green TB resistance    Ankle Exercises: Sidelying   Other Sidelying Ankle Exercises hip abduction with ankle in neutral to eversion x20   leading with ankle eversion each lift  Other Sidelying Ankle Exercises clam x 20 green TB    Ankle Exercises: Supine   T-Band TB  4 directions x 10 red TB   working on technique - pause and hold and position of ankle                     PT Long Term Goals - 12/11/15 0857    PT LONG TERM GOAL #1   Title Increase bilat ankle DF to 12-15 deg with no pain 01/07/16   Time 6   Period Weeks   Status On-going   PT LONG TERM GOAL #2   Title 5-/5 to 5/5 strength bilat ankles/hips 01/07/16   Time 6   Period Weeks   Status On-going   PT LONG TERM GOAL #3   Title Decrease pain Rt ankle to no more than 1/10 to 3/10 for 75% of day 01/07/16   Time 6   Period Weeks   Status On-going   PT LONG TERM GOAL #4   Title Improve gait pattern with pt to ambulate without limp 01/07/16   Time 6   Period Weeks   Status On-going   PT LONG TERM GOAL #5   Title I in HEP 01/07/16   Time 6   Period Weeks   Status On-going   PT LONG TERM GOAL #6   Title Improve FOTO to </= 30% limitation  01/07/16   Time 6   Period Weeks   Status On-going               Plan - 12/11/15 0856    Clinical Impression Statement Continues to add HEP and progress exercises in clinic. Improving ankle ROM and strength    Rehab Potential Good   PT Frequency 2x / week   PT Duration 6 weeks   PT Treatment/Interventions Patient/family education;Neuromuscular re-education;ADLs/Self Care Home Management;Therapeutic exercise;Therapeutic activities;Manual techniques;Dry needling;Cryotherapy;Electrical Stimulation;Iontophoresis 15m/ml Dexamethasone;Moist Heat;Ultrasound   PT Next Visit Plan Progress stretching and strengthening program(gastroc/soleus stretches; strengthening LE); cont ionto Rt ankle to address pain; modalities and manual work as indicated    PT HWilliamsand Agree with Plan of Care Patient      Patient will benefit from skilled therapeutic intervention in order to improve the following deficits and impairments:  Postural dysfunction, Improper body mechanics, Increased fascial restricitons, Decreased range of motion, Decreased mobility, Decreased strength, Abnormal gait, Pain, Decreased endurance, Decreased activity tolerance  Visit Diagnosis: Abnormal gait  Pain, ankle, right  Weakness generalized     Problem List Patient Active Problem List   Diagnosis Date Noted  . Sprain of ankle, right 11/13/2015  . History of migraine headaches 08/31/2013  . History of anemia 08/31/2013    Gloria Cooke PNilda SimmerPT, MPH  12/11/2015, 9:35 AM  [facility] [address]Phone: [phone]  Fax:  [phone] Name: Gloria BartnickMRN: [id number] of Birth: [birth year]   PHYSICAL THERAPY DISCHARGE SUMMARY  Visits from Start of Care: 4  Current functional level related to goals / functional outcomes: Improving ROM and strength. Patient ambulating with less limp.  Progressing with exercises and functional activity level.    Remaining deficits: Intermittent pain; continued weakness and functional activity limitation   Education / Equipment: HEP   Plan: Patient agrees to discharge.  Patient goals were partially met. Patient is being discharged due to not returning since the last visit.  ?????  Gloria Cooke P. Helene Kelp PT, MPH 02/19/2016 4:59 PM

## 2015-12-12 ENCOUNTER — Other Ambulatory Visit: Payer: Self-pay | Admitting: Sports Medicine

## 2015-12-12 MED ORDER — TRAMADOL HCL 50 MG PO TABS: 50.0000 mg | ORAL_TABLET | Freq: Three times a day (TID) | ORAL | Status: AC | PRN

## 2015-12-12 NOTE — Addendum Note (Signed)
Addended by: Monica BectonHEKKEKANDAM, Luetta Piazza J on: 12/12/2015 10:59 AM   Modules accepted: Orders

## 2015-12-16 ENCOUNTER — Encounter: Payer: Managed Care, Other (non HMO) | Admitting: Rehabilitative and Restorative Service Providers"

## 2015-12-16 ENCOUNTER — Ambulatory Visit (HOSPITAL_COMMUNITY): Payer: Managed Care, Other (non HMO) | Admitting: Psychiatry

## 2015-12-18 ENCOUNTER — Encounter: Payer: Managed Care, Other (non HMO) | Admitting: Rehabilitative and Restorative Service Providers"

## 2015-12-25 ENCOUNTER — Ambulatory Visit (HOSPITAL_COMMUNITY): Payer: Managed Care, Other (non HMO) | Admitting: Psychiatry

## 2015-12-26 ENCOUNTER — Ambulatory Visit (HOSPITAL_COMMUNITY): Payer: Managed Care, Other (non HMO) | Admitting: Licensed Clinical Social Worker

## 2015-12-27 ENCOUNTER — Ambulatory Visit (HOSPITAL_COMMUNITY): Payer: Managed Care, Other (non HMO) | Admitting: Psychiatry

## 2016-01-02 ENCOUNTER — Ambulatory Visit: Payer: Managed Care, Other (non HMO) | Admitting: Sports Medicine

## 2016-01-09 ENCOUNTER — Ambulatory Visit (HOSPITAL_COMMUNITY): Payer: Managed Care, Other (non HMO) | Admitting: Licensed Clinical Social Worker

## 2016-07-17 ENCOUNTER — Ambulatory Visit: Payer: Managed Care, Other (non HMO) | Admitting: Physician Assistant

## 2016-09-25 ENCOUNTER — Ambulatory Visit: Payer: Self-pay | Admitting: Physician Assistant

## 2016-09-27 ENCOUNTER — Encounter: Payer: Self-pay | Admitting: Emergency Medicine

## 2016-09-27 ENCOUNTER — Emergency Department (INDEPENDENT_AMBULATORY_CARE_PROVIDER_SITE_OTHER)
Admission: EM | Admit: 2016-09-27 | Discharge: 2016-09-27 | Disposition: A | Discharge status: Home / Self Care | Payer: Managed Care, Other (non HMO) | Source: Home / Self Care | Attending: Family Medicine | Admitting: Family Medicine

## 2016-09-27 ENCOUNTER — Emergency Department (INDEPENDENT_AMBULATORY_CARE_PROVIDER_SITE_OTHER): Payer: Managed Care, Other (non HMO)

## 2016-09-27 DIAGNOSIS — R0781 Pleurodynia: Secondary | ICD-10-CM

## 2016-09-27 DIAGNOSIS — W109XXA Fall (on) (from) unspecified stairs and steps, initial encounter: Secondary | ICD-10-CM | POA: Diagnosis not present

## 2016-09-27 DIAGNOSIS — M25562 Pain in left knee: Secondary | ICD-10-CM

## 2016-09-27 DIAGNOSIS — S99911A Unspecified injury of right ankle, initial encounter: Secondary | ICD-10-CM

## 2016-09-27 DIAGNOSIS — W108XXA Fall (on) (from) other stairs and steps, initial encounter: Secondary | ICD-10-CM

## 2016-09-27 NOTE — ED Provider Notes (Signed)
CSN: [id number]     Arrival date & time 09/27/16  1643 History   First MD Initiated Contact with Patient 09/27/16 1702     Chief Complaint  Patient presents with  . Fall   (Consider location/radiation/quality/duration/timing/severity/associated sxs/prior Treatment) HPI  Lowanda Fostershley Eskin is a 31 y.o. female presenting to UC with c/o diffuse pain after fall down stairs earlier today.  Pt notes she was carrying a laundry basket down the stairs when their pet ferret caused her to trip.  Denies hitting her head or LOC but notes she jammed the basket into her side resulting in Left side rib pain, Left knee pain and Right ankle pain.  Hx of Right ankle injury about 1 year ago but no hx of surgeries on Right ankle.  Pt notes she cannot have NSAIDs or tylenol due to hx of migraines.    Past Medical History:  Diagnosis Date  . Anxiety   . Depression   . Migraine    Past Surgical History:  Procedure Laterality Date  . DENTAL SURGERY     No family history on file. Social History  Substance Use Topics  . Smoking status: Never Smoker  . Smokeless tobacco: Never Used  . Alcohol use No   OB History    No data available     Review of Systems  Respiratory: Negative for cough, chest tightness and shortness of breath.   Cardiovascular: Positive for chest pain (Left side ribs). Negative for palpitations.  Musculoskeletal: Positive for arthralgias and myalgias. Negative for gait problem, joint swelling, neck pain and neck stiffness.  Skin: Negative for color change and wound.  Neurological: Negative for weakness and numbness.    Allergies  Toradol [ketorolac tromethamine] and Sulfa antibiotics  Home Medications   Prior to Admission medications   Medication Sig Start Date End Date Taking? Authorizing Provider  Ondansetron HCl (ZOFRAN PO) Take by mouth.   Yes Historical Provider, MD  Rizatriptan Benzoate (MAXALT PO) Take by mouth.   Yes Historical Provider, MD  traMADol (ULTRAM) 50 MG tablet  Take 1 tablet (50 mg total) by mouth 3 (three) times daily as needed. 12/12/15   Monica Bectonhomas J Thekkekandam, MD   Meds Ordered and Administered this Visit  Medications - No data to display  BP 134/94 (BP Location: Left Arm)   Pulse 71   Temp 98.4 F (36.9 C) (Oral)   Ht 5' 5.5" (1.664 m)   Wt 194 lb 8 oz (88.2 kg)   LMP 09/13/2016   SpO2 97%   BMI 31.87 kg/m  No data found.   Physical Exam  Constitutional: She is oriented to person, place, and time. She appears well-developed and well-nourished. No distress.  HENT:  Head: Normocephalic and atraumatic.  Eyes: EOM are normal.  Neck: Normal range of motion.  Cardiovascular: Normal rate and regular rhythm.   Pulmonary/Chest: Effort normal and breath sounds normal. No respiratory distress. She has no wheezes. She has no rales. She exhibits tenderness ( Left side ribs, no deformity or crepitus).  Musculoskeletal: Normal range of motion. She exhibits edema and tenderness.  Right ankle: mild edema to lateral aspect, minimally tender. Full ROM ankle and toes. No tenderness to foot. Calf is soft, non-tender. Left knee: no obvious edema. Mild tenderness to anterior aspect. Full ROM, no crepitus.   Neurological: She is alert and oriented to person, place, and time.  Skin: Skin is warm and dry. Capillary refill takes less than 2 seconds. She is not diaphoretic. No erythema.  Skin  in tact, no ecchymosis or erythema.  Psychiatric: She has a normal mood and affect. Her behavior is normal.  Nursing note and vitals reviewed.   Urgent Care Course     Procedures (including critical care time)  Labs Review Labs Reviewed - No data to display  Imaging Review Dg Ribs Unilateral W/chest Left  Result Date: 09/27/2016 CLINICAL DATA:  Fall down 10 steps, left rib pain EXAM: LEFT RIBS AND CHEST - 3+ VIEW COMPARISON:  None. FINDINGS: Lungs are clear.  No pleural effusion or pneumothorax. The heart is normal in size. No displaced left rib fracture is  seen. IMPRESSION: No evidence of acute cardiopulmonary disease. No displaced left rib fracture is seen. Electronically Signed   By: Charline Bills M.D.   On: 09/27/2016 17:41   Dg Ankle Complete Right  Result Date: 09/27/2016 CLINICAL DATA:  Fall down 10 steps, right ankle injury EXAM: RIGHT ANKLE - COMPLETE 3+ VIEW COMPARISON:  None. FINDINGS: No fracture or dislocation is seen. The ankle mortise is intact. Mild soft tissue swelling overlying the lateral malleolus. IMPRESSION: No fracture or dislocation is seen. Mild lateral soft tissue swelling. Electronically Signed   By: Charline Bills M.D.   On: 09/27/2016 17:41   Dg Knee Complete 4 Views Left  Result Date: 09/27/2016 CLINICAL DATA:  Fall down 10 steps, left knee pain EXAM: LEFT KNEE - COMPLETE 4+ VIEW COMPARISON:  None. FINDINGS: No fracture or dislocation is seen. The joint spaces are preserved. The visualized soft tissues are unremarkable. Possible small suprapatellar knee joint effusion. IMPRESSION: No fracture or dislocation is seen. Possible small suprapatellar knee effusion. Electronically Signed   By: Charline Bills M.D.   On: 09/27/2016 17:42    MDM   1. Fall down stairs, initial encounter   2. Right ankle injury, initial encounter   3. Left knee pain   4. Rib pain on left side    Pt c/o multiple areas of pain after trip and fall down stairs. Denies head or neck injury.  Plain films: mild edema on Right ankle and Left knee w/o evidence of dislocation or fracture.  Pt note she has crutches and ankle splints at home. Encouraged rest, ice, compression, and elevation.  No indication for narcotics at this time.  F/u with PCP or Sports Medicine in 1-2 weeks if not improving.     Junius Finner, PA-C 09/27/16 [phone]

## 2016-09-27 NOTE — ED Triage Notes (Signed)
Pt tripped over her pet Ferrett today while carrying the laundry upstairs.  Pt c/o right ankle pain, left side and knee pain, no SOB.

## 2017-02-22 IMAGING — CR DG ANKLE COMPLETE 3+V*R*
3 series · 3 of 3 positions shown · non-contrast
Comparison: 11/01/2015

CLINICAL DATA: Fall with twisting injury to the right ankle
yesterday. Pain and swelling laterally.

EXAM:
RIGHT ANKLE - COMPLETE 3+ VIEW

[t ankle joint ap right]
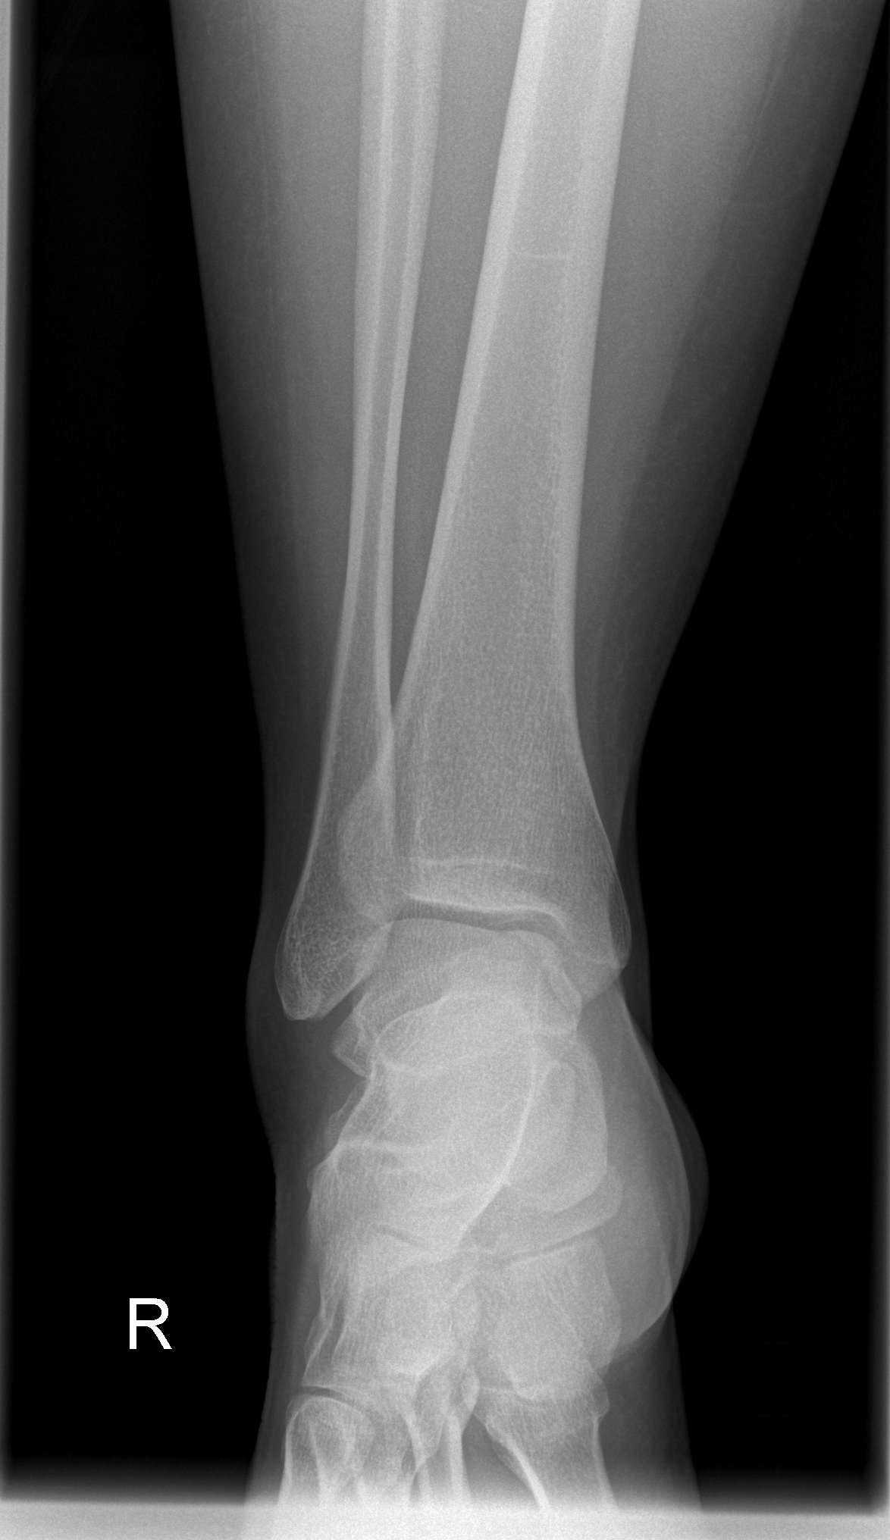

[t ankle joint oblique right]
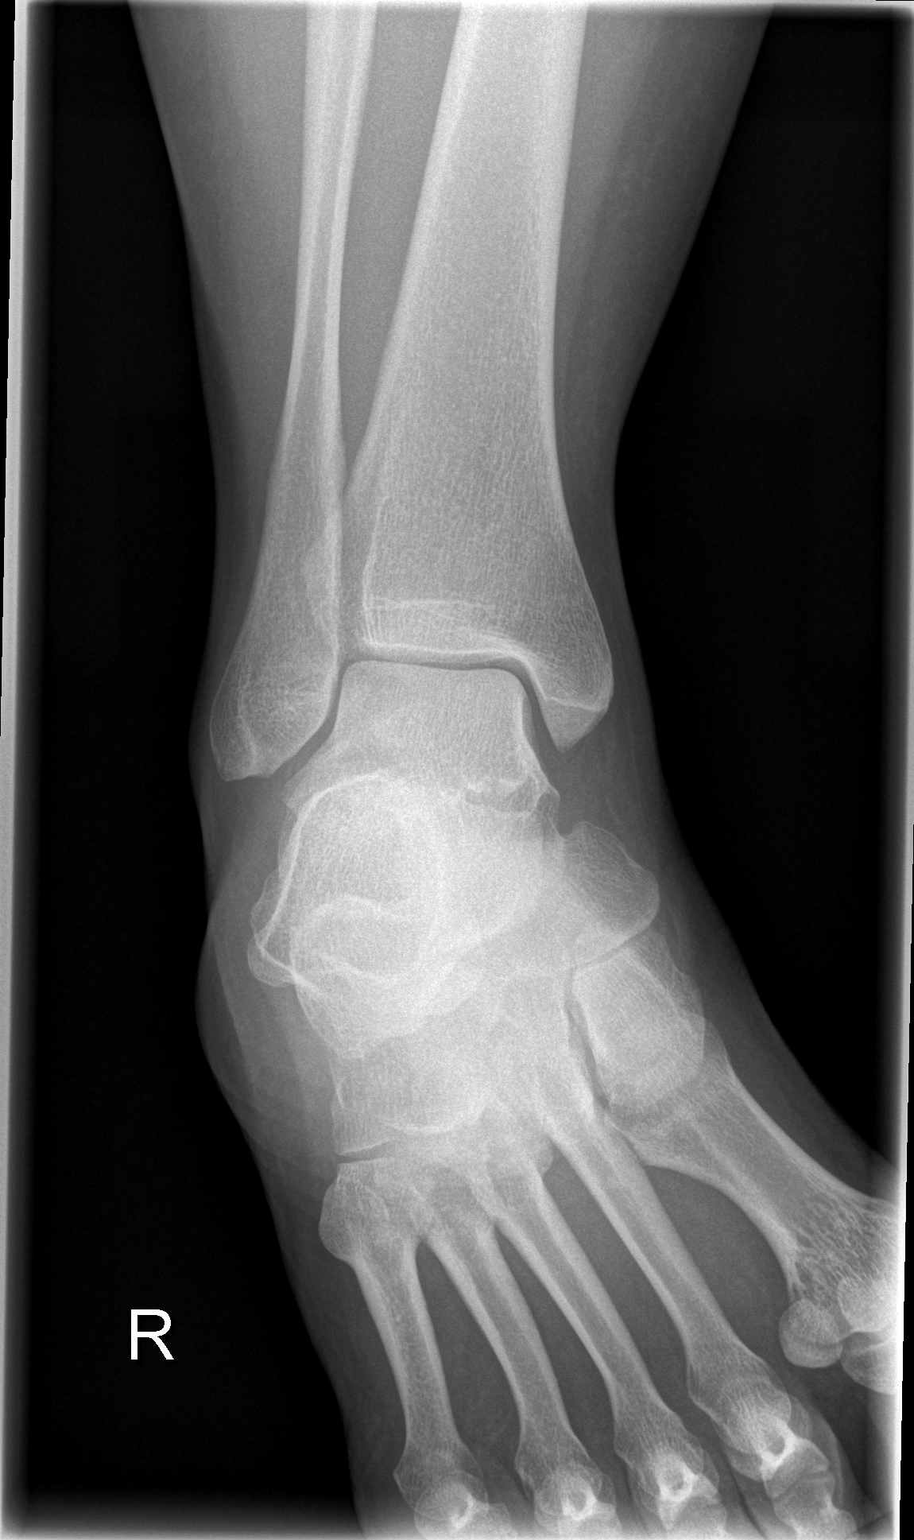

[t ankle joint lat right]
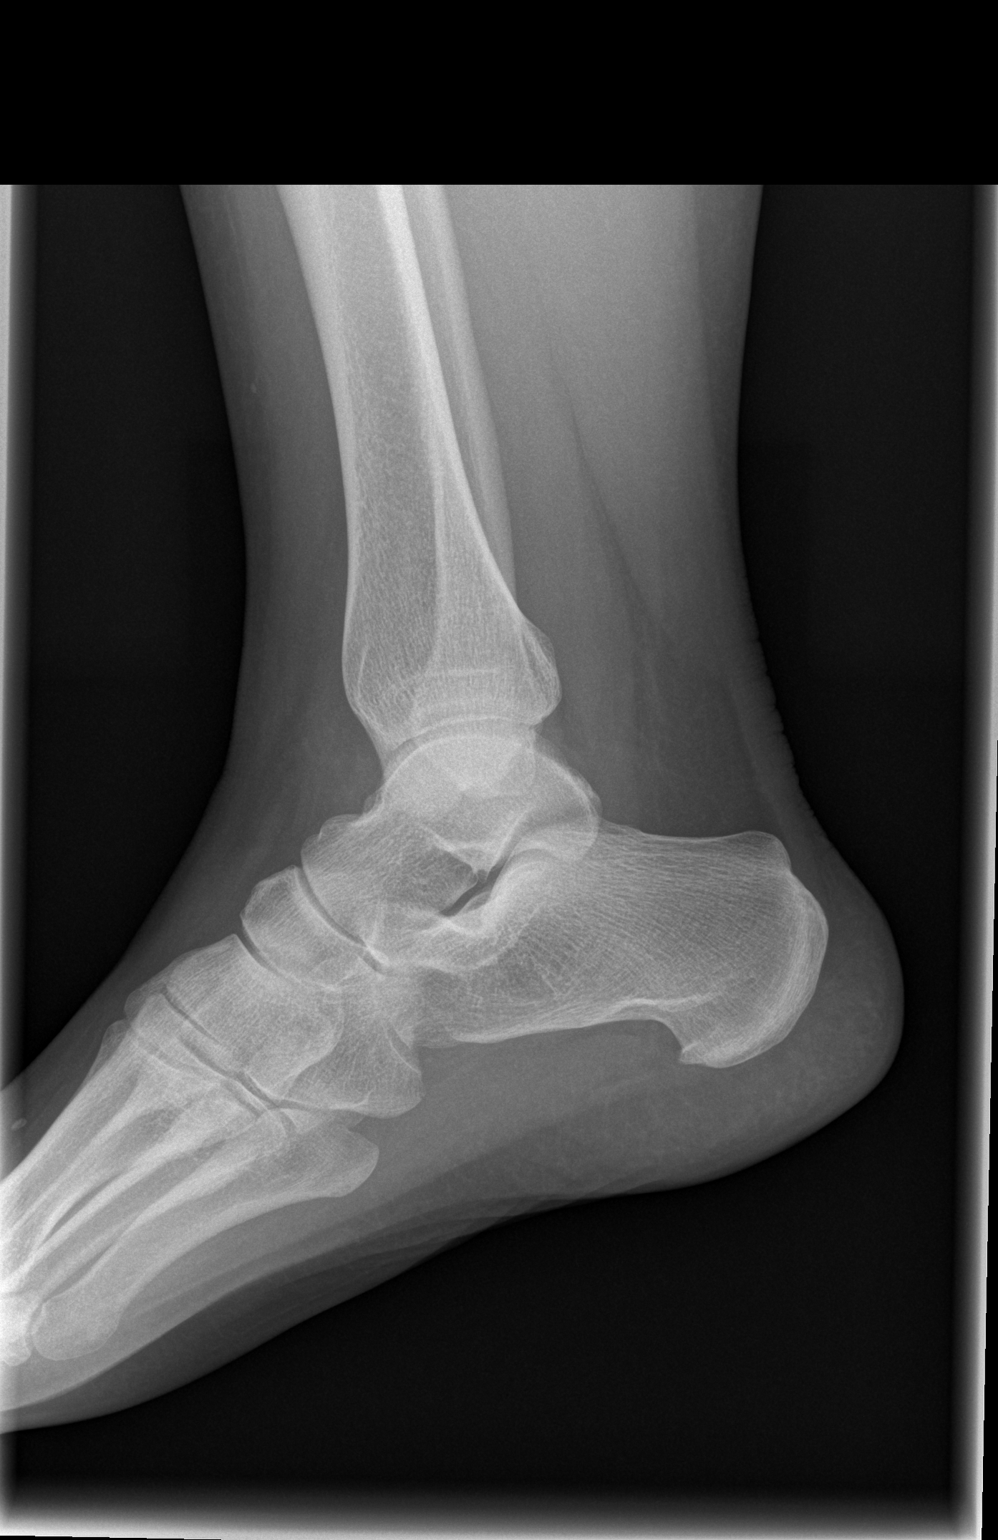

[3 of 3 positions shown; findings below may reference images not displayed]

FINDINGS: There is no evidence of fracture, dislocation, or joint effusion.
There is no evidence of arthropathy or other focal bone abnormality.
Soft tissues are unremarkable.
IMPRESSION: Negative.

## 2018-01-13 IMAGING — DX DG ANKLE COMPLETE 3+V*R*
3 series · 3 of 3 positions shown · non-contrast
Comparison: None.

CLINICAL DATA: Fall down 10 steps, right ankle injury

EXAM:
RIGHT ANKLE - COMPLETE 3+ VIEW

[ankle ap]
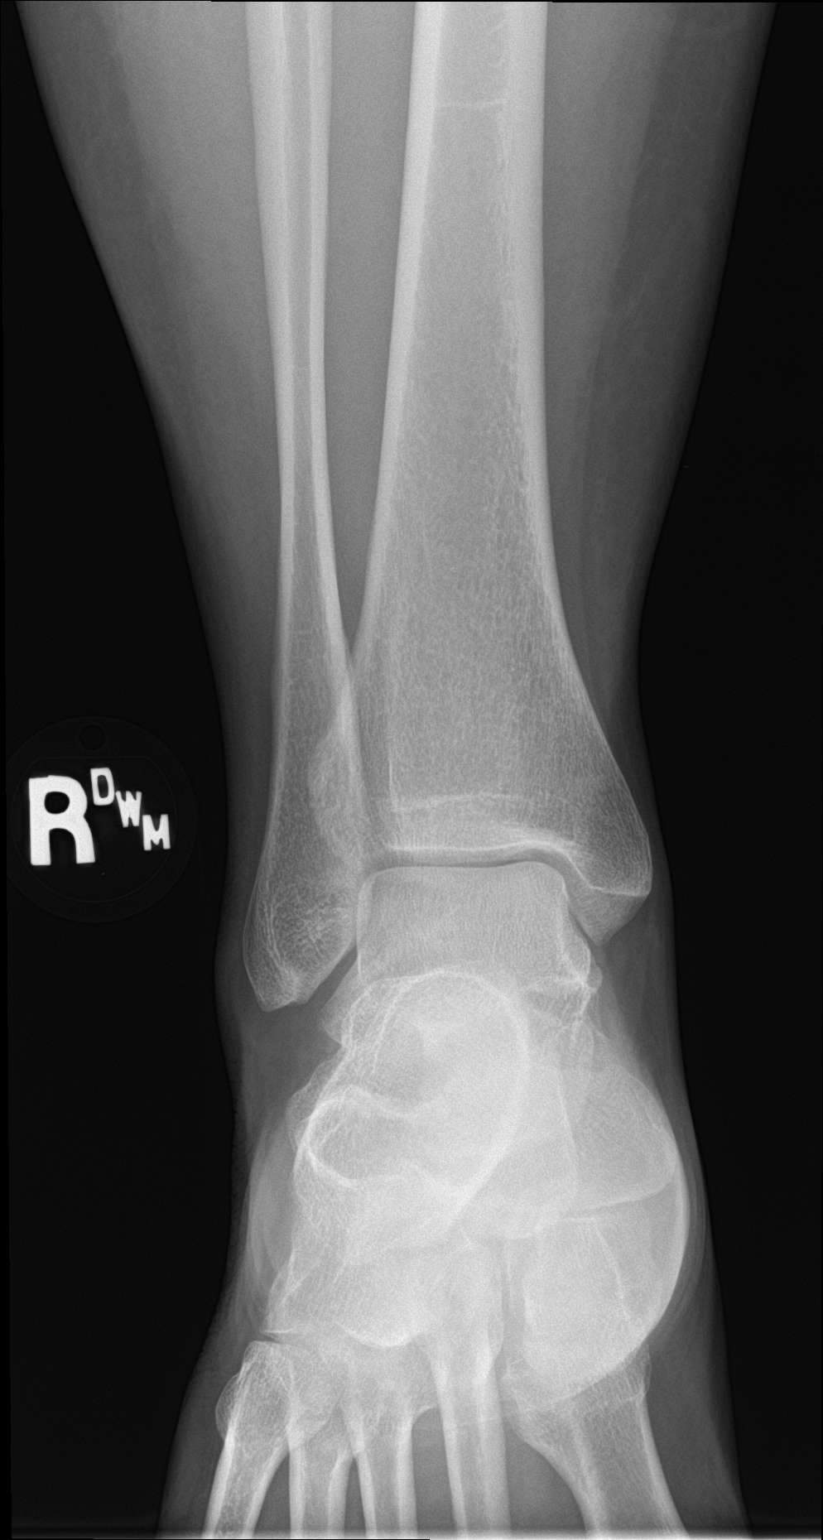

[ankle obl]
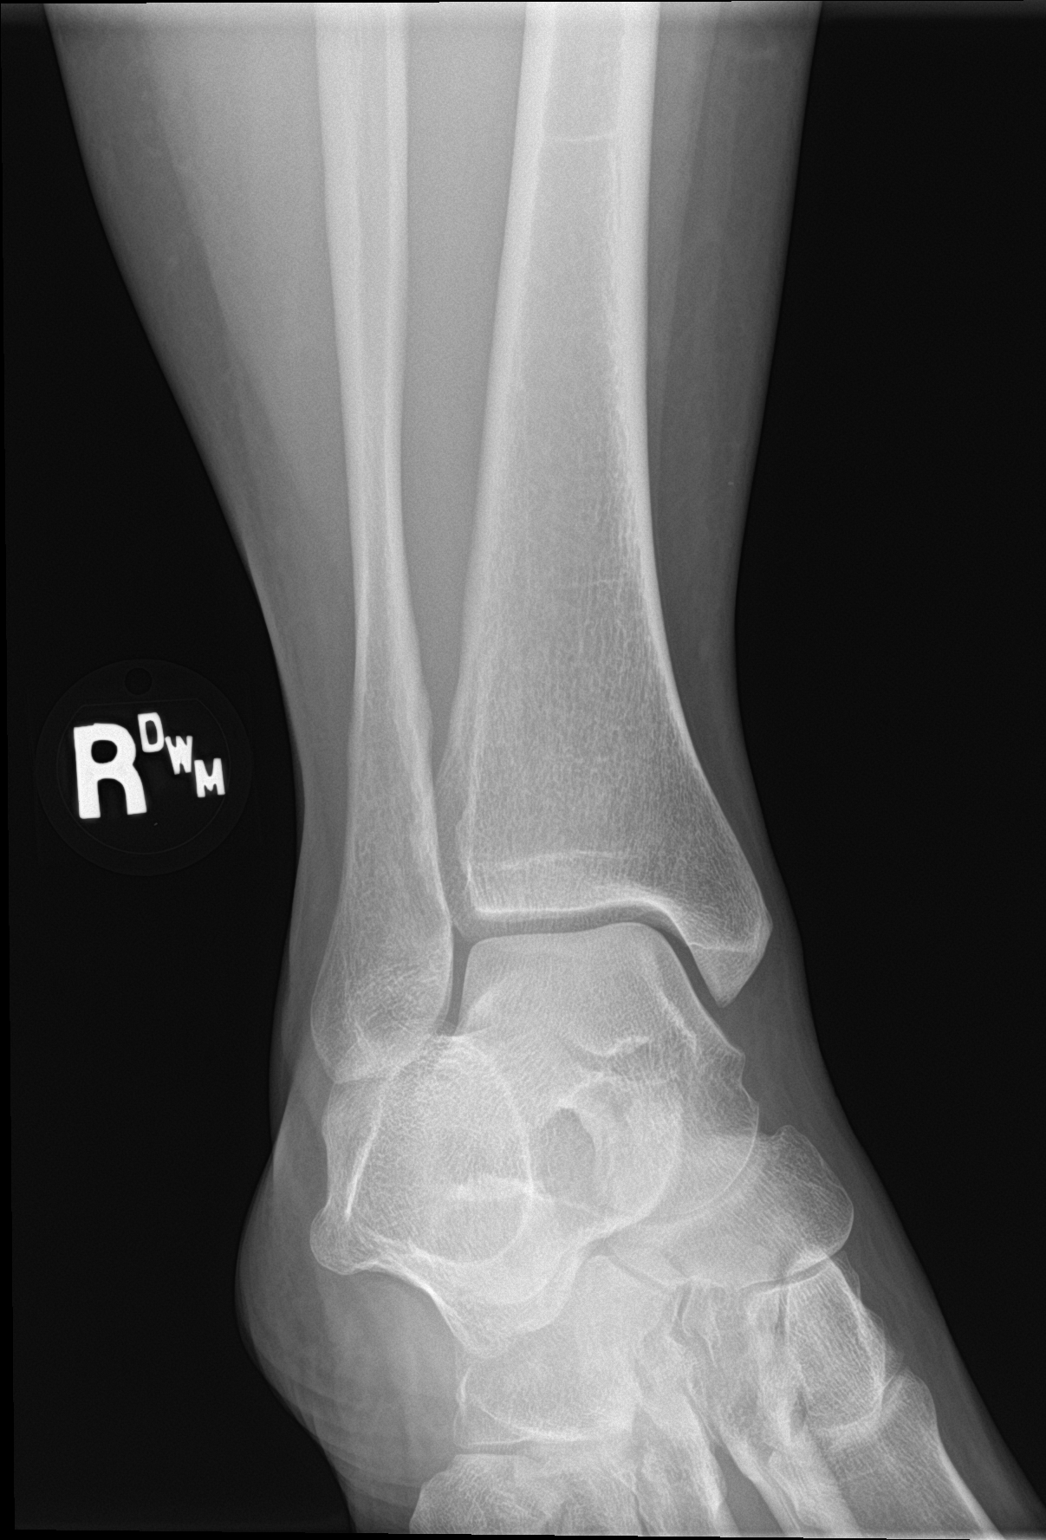

[ankle lat]
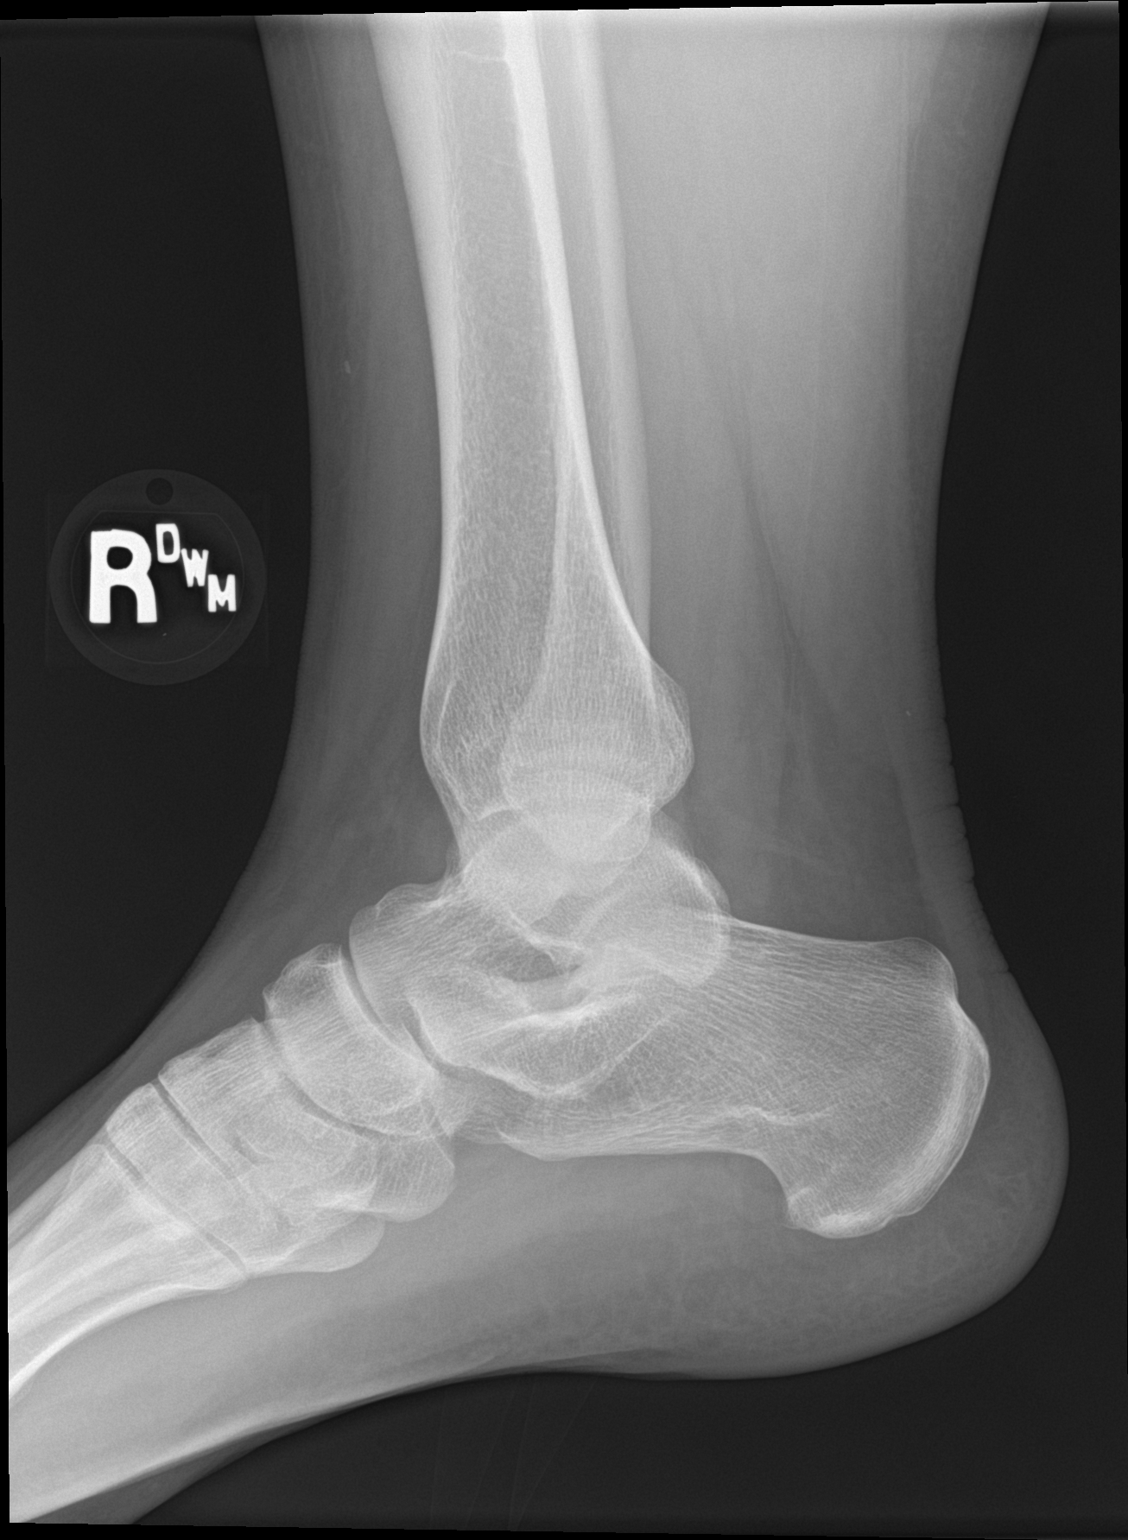

[3 of 3 positions shown; findings below may reference images not displayed]

FINDINGS: No fracture or dislocation is seen.

The ankle mortise is intact.

Mild soft tissue swelling overlying the lateral malleolus.
IMPRESSION: No fracture or dislocation is seen.

Mild lateral soft tissue swelling.

## 2024-08-08 ENCOUNTER — Telehealth: Admitting: Family Medicine

## 2024-08-08 DIAGNOSIS — K047 Periapical abscess without sinus: Secondary | ICD-10-CM | POA: Diagnosis not present

## 2024-08-09 MED ORDER — AMOXICILLIN-POT CLAVULANATE 875-125 MG PO TABS: 1.0000 | ORAL_TABLET | Freq: Two times a day (BID) | ORAL | 0 refills | Status: AC

## 2024-08-09 NOTE — Progress Notes (Signed)
 E-Visit for Dental Pain  We are sorry that you are not feeling well.  Here is how we plan to help!  Based on what you have shared with me in the questionnaire, it sounds like you have dental infection.   Augmentin  875-125mg  twice a day for 7 days  It is imperative that you see a dentist within 10 days of this eVisit to determine the cause of the dental pain and be sure it is adequately treated  A toothache or tooth pain is caused when the nerve in the root of a tooth or surrounding a tooth is irritated. Dental (tooth) infection, decay, injury, or loss of a tooth are the most common causes of dental pain. Pain may also occur after an extraction (tooth is pulled out). Pain sometimes originates from other areas and radiates to the jaw, thus appearing to be tooth pain.Bacteria growing inside your mouth can contribute to gum disease and dental decay, both of which can cause pain. A toothache occurs from inflammation of the central portion of the tooth called pulp. The pulp contains nerve endings that are very sensitive to pain. Inflammation to the pulp or pulpitis may be caused by dental cavities, trauma, and infection.    HOME CARE:   For toothaches: Over-the-counter pain medications such as acetaminophen  or ibuprofen may be used. Take these as directed on the package while you arrange for a dental appointment. Avoid very cold or hot foods, because they may make the pain worse. You may get relief from biting on a cotton ball soaked in oil of cloves. You can get oil of cloves at most drug stores.  For jaw pain:  Aspirin may be helpful for problems in the joint of the jaw in adults. If pain happens every time you open your mouth widely, the temporomandibular joint (TMJ) may be the source of the pain. Yawning or taking a large bite of food may worsen the pain. An appointment with your doctor or dentist will help you find the cause.     GET HELP RIGHT AWAY IF:  You have a high fever or chills If  you have had a recent head or face injury and develop headache, light headedness, nausea, vomiting, or other symptoms that concern you after an injury to your face or mouth, you could have a more serious injury in addition to your dental injury. A facial rash associated with a toothache: This condition may improve with medication. Contact your doctor for them to decide what is appropriate. Any jaw pain occurring with chest pain: Although jaw pain is most commonly caused by dental disease, it is sometimes referred pain from other areas. People with heart disease, especially people who have had stents placed, people with diabetes, or those who have had heart surgery may have jaw pain as a symptom of heart attack or angina. If your jaw or tooth pain is associated with lightheadedness, sweating, or shortness of breath, you should see a doctor as soon as possible. Trouble swallowing or excessive pain or bleeding from gums: If you have a history of a weakened immune system, diabetes, or steroid use, you may be more susceptible to infections. Infections can often be more severe and extensive or caused by unusual organisms. Dental and gum infections in people with these conditions may require more aggressive treatment. An abscess may need draining or IV antibiotics, for example.  MAKE SURE YOU   Understand these instructions. Will watch your condition. Will get help right away if you are  not doing well or get worse.  Thank you for choosing an e-visit.  Your e-visit answers were reviewed by a board certified advanced clinical practitioner to complete your personal care plan. Depending upon the condition, your plan could have included both over the counter or prescription medications.  Please review your pharmacy choice. Make sure the pharmacy is open so you can pick up prescription now. If there is a problem, you may contact your provider through [employer] and have the prescription routed to another  pharmacy.  Your safety is important to us . If you have drug allergies check your prescription carefully.   For the next 24 hours you can use MyChart to ask questions about today's visit, request a non-urgent call back, or ask for a work or school excuse. You will get an email in the next two days asking about your experience. I hope that your e-visit has been valuable and will speed your recovery.  I have spent 5 minutes in review of e-visit questionnaire, review and updating patient chart, medical decision making and response to patient.   Roosvelt Mater, PA-C
